# Patient Record
Sex: Male | Born: 1945
Health system: Southern US, Community
[De-identification: ages and names within clinical notes are randomized; demographics above are authoritative.]

## PROBLEM LIST (undated history)

## (undated) DIAGNOSIS — K429 Umbilical hernia without obstruction or gangrene: Secondary | ICD-10-CM

## (undated) DIAGNOSIS — M1990 Unspecified osteoarthritis, unspecified site: Secondary | ICD-10-CM

## (undated) DIAGNOSIS — K219 Gastro-esophageal reflux disease without esophagitis: Secondary | ICD-10-CM

## (undated) DIAGNOSIS — I10 Essential (primary) hypertension: Secondary | ICD-10-CM

## (undated) DIAGNOSIS — Z87442 Personal history of urinary calculi: Secondary | ICD-10-CM

## (undated) DIAGNOSIS — E785 Hyperlipidemia, unspecified: Secondary | ICD-10-CM

## (undated) DIAGNOSIS — N200 Calculus of kidney: Secondary | ICD-10-CM

## (undated) DIAGNOSIS — K4090 Unilateral inguinal hernia, without obstruction or gangrene, not specified as recurrent: Secondary | ICD-10-CM

## (undated) HISTORY — DX: Calculus of kidney: N20.0

## (undated) HISTORY — PX: HERNIA REPAIR: SHX51

## (undated) HISTORY — DX: Essential (primary) hypertension: I10

## (undated) HISTORY — PX: VASECTOMY: SHX75

## (undated) HISTORY — DX: Unspecified osteoarthritis, unspecified site: M19.90

## (undated) HISTORY — DX: Umbilical hernia without obstruction or gangrene: K42.9

## (undated) HISTORY — DX: Gastro-esophageal reflux disease without esophagitis: K21.9

---

## 2006-05-21 ENCOUNTER — Ambulatory Visit: Payer: Self-pay | Admitting: Internal Medicine

## 2006-05-21 LAB — CONVERTED CEMR LAB
ALT: 14 units/L (ref 0–40)
AST: 21 units/L (ref 0–37)
Alkaline Phosphatase: 71 units/L (ref 39–117)
BUN: 9 mg/dL (ref 6–23)
Basophils Relative: 2.9 % — ABNORMAL HIGH (ref 0.0–1.0)
Bilirubin, Direct: 0.1 mg/dL (ref 0.0–0.3)
CO2: 28 meq/L (ref 19–32)
Calcium: 8.9 mg/dL (ref 8.4–10.5)
Creatinine, Ser: 0.8 mg/dL (ref 0.4–1.5)
Crystals: NEGATIVE
Eosinophils Absolute: 0.1 10*3/uL (ref 0.0–0.6)
Eosinophils Relative: 2 % (ref 0.0–5.0)
GFR calc Af Amer: 127 mL/min
Glucose, Bld: 109 mg/dL — ABNORMAL HIGH (ref 70–99)
HCT: 44.7 % (ref 39.0–52.0)
Hemoglobin, Urine: NEGATIVE
Hemoglobin: 15.3 g/dL (ref 13.0–17.0)
Ketones, ur: NEGATIVE mg/dL
Lymphocytes Relative: 21.7 % (ref 12.0–46.0)
MCV: 92.1 fL (ref 78.0–100.0)
Neutro Abs: 4.1 10*3/uL (ref 1.4–7.7)
Neutrophils Relative %: 66.7 % (ref 43.0–77.0)
Total Bilirubin: 0.9 mg/dL (ref 0.3–1.2)
Total Protein, Urine: NEGATIVE mg/dL
Total Protein: 6.7 g/dL (ref 6.0–8.3)
Urine Glucose: NEGATIVE mg/dL
Urobilinogen, UA: 0.2 (ref 0.0–1.0)
VLDL: 19 mg/dL (ref 0–40)
WBC: 6.1 10*3/uL (ref 4.5–10.5)

## 2006-06-04 ENCOUNTER — Ambulatory Visit: Payer: Self-pay | Admitting: Internal Medicine

## 2006-07-13 ENCOUNTER — Ambulatory Visit: Payer: Self-pay | Admitting: Family Medicine

## 2007-02-17 ENCOUNTER — Ambulatory Visit: Payer: Self-pay | Admitting: Internal Medicine

## 2007-02-17 DIAGNOSIS — R972 Elevated prostate specific antigen [PSA]: Secondary | ICD-10-CM

## 2007-02-20 ENCOUNTER — Encounter: Payer: Self-pay | Admitting: Internal Medicine

## 2007-02-26 ENCOUNTER — Telehealth: Payer: Self-pay | Admitting: Internal Medicine

## 2007-03-03 ENCOUNTER — Encounter: Payer: Self-pay | Admitting: Internal Medicine

## 2007-06-18 ENCOUNTER — Encounter: Payer: Self-pay | Admitting: Internal Medicine

## 2007-06-18 DIAGNOSIS — Z9189 Other specified personal risk factors, not elsewhere classified: Secondary | ICD-10-CM | POA: Insufficient documentation

## 2007-06-18 DIAGNOSIS — IMO0001 Reserved for inherently not codable concepts without codable children: Secondary | ICD-10-CM

## 2007-06-18 DIAGNOSIS — M1990 Unspecified osteoarthritis, unspecified site: Secondary | ICD-10-CM | POA: Insufficient documentation

## 2007-06-18 DIAGNOSIS — K219 Gastro-esophageal reflux disease without esophagitis: Secondary | ICD-10-CM | POA: Insufficient documentation

## 2007-06-18 DIAGNOSIS — Z9889 Other specified postprocedural states: Secondary | ICD-10-CM

## 2007-10-31 ENCOUNTER — Encounter: Payer: Self-pay | Admitting: Internal Medicine

## 2008-05-14 ENCOUNTER — Encounter: Payer: Self-pay | Admitting: Internal Medicine

## 2008-11-19 ENCOUNTER — Encounter: Payer: Self-pay | Admitting: Internal Medicine

## 2009-05-20 ENCOUNTER — Encounter: Payer: Self-pay | Admitting: Internal Medicine

## 2009-06-09 ENCOUNTER — Ambulatory Visit: Payer: Self-pay | Admitting: Internal Medicine

## 2009-06-09 DIAGNOSIS — J309 Allergic rhinitis, unspecified: Secondary | ICD-10-CM

## 2009-12-02 ENCOUNTER — Encounter: Payer: Self-pay | Admitting: Internal Medicine

## 2010-03-07 NOTE — Letter (Signed)
[facility]   Imported By: Sherian Rein 06/02/2009 09:06:38  _____________________________________________________________________  External Attachment:    Type:   Image     Comment:   External Document

## 2010-03-07 NOTE — Assessment & Plan Note (Signed)
Summary: cough,cold/tired/cd   Vital Signs:  Patient profile:   65 year old male Height:      67 inches Weight:      168 pounds BMI:     26.41 O2 Sat:      97 % on Room air Temp:     98.4 degrees F oral Pulse rate:   78 / minute BP sitting:   128 / 88  (left arm) Cuff size:   regular  Vitals Entered By: Bill Salinas CMA (Jun 09, 2009 2:58 PM)  O2 Flow:  Room air CC: pt here with complaint of head congestion, runny nose, cough, and fatigue x about 3 weeks/ ab   CC:  pt here with complaint of head congestion, runny nose, cough, and and fatigue x about 3 weeks/ ab.  History of Present Illness: The patient has been in good health until approximately 3-4 weeks ago when he began to have sneezing, runny nose, watery eyes, headache, sinus pressure and cough.  At that time he took Mucinex with minimal relief in symptoms.  Since then he has had waxing and waning of his symptoms but continues to have a dry, nonproductive cough as well as intermittent watery eyes and runny nose.  He also complains of feeling overall weak and tired.  He is still able to complete his usual daily activities.  He denies fever, chills, loss of appetite, swollen lymph nodes, or shortness of breath.  He endorses chest pain, but only a sharp pain when coughing.  He has no known history of environmental allergies.  The patient is very interested in having a CXR to rule out any serious problems.    Current Medications (verified): 1)  None  Allergies (verified): No Known Drug Allergies  Past History:  Past Medical History: Last updated: 06/18/2007 GERD Family history mother has arthritis. also has a hx of breast cancer and is a survivor. Father witjh hyperlipidemia, heart disease, hypertension and diabetes. Osteoarthritis  Past Surgical History: Last updated: 06/18/2007 Vasectomy Trauma, fracture of the left index finger. Hernia Surgery (1984)  Risk Factors: Smoking Status: never (06/18/2007)  Review of  Systems       The patient complains of chest pain, prolonged cough, and headaches.  The patient denies anorexia, fever, weight loss, weight gain, decreased hearing, hoarseness, dyspnea on exertion, hemoptysis, abdominal pain, and enlarged lymph nodes.         chest pain as noted in HPI- sharp pain with cough.  Physical Exam  General:  Alert, wellnourished man in no apparent distress. Head:  Normocephalic, atraumatic. Eyes:  Vision grossly intact.  PERRL.  no conjunctival injection.  Excessive tearing noted. Ears:  R ear normal and L ear normal.   Nose:  no external deformity and no nasal discharge.   Mouth:  pharynx pink and moist.   Neck:  supple and no masses.   Lungs:  normal respiratory effort, normal breath sounds, no crackles, and no wheezes.   Heart:  normal rate, regular rhythm, no murmur, no gallop, and no rub.   Abdomen:  soft, non-tender, no distention, no masses, no guarding, and no rigidity.   Cervical Nodes:  no anterior cervical adenopathy and no posterior cervical adenopathy.     Impression & Recommendations:  Problem # 1:  ALLERGIC RHINITIS (ICD-477.9) The patient's presentation is consistent with an allergic rhinitis.  He was reassured that the had no symptoms concerning for infection at this time.   Plan- Loratadine 10mg  once daily, sudafed 30mg  2-3x/day  as needed for sinus pressure, benzonatate 100mg  as needed for cough, Mucinex as needed. Pt was counseled on signs concerning for infection and told to return if he develops fever or purulent sputum.    Problem # 2:  ELEVATED PROSTATE SPECIFIC ANTIGEN (ICD-790.93) Patient continues to be followed by Dr. Laverle Patter for his elevated PSA.  He was last seen 05/20/09 and will follow up again in 6months.  Dr. Laverle Patter is also following the patient's nephrolithiasis.

## 2010-03-07 NOTE — Letter (Signed)
[facility]   Imported By: Sherian Rein 12/14/2009 10:26:50  _____________________________________________________________________  External Attachment:    Type:   Image     Comment:   External Document

## 2010-04-13 ENCOUNTER — Encounter: Payer: Self-pay | Admitting: Internal Medicine

## 2010-04-25 NOTE — Letter (Signed)
[facility]: [facility]   Imported By: Sherian Rein 04/19/2010 11:02:21  _____________________________________________________________________  External Attachment:    Type:   Image     Comment:   External Document

## 2010-06-23 NOTE — Assessment & Plan Note (Signed)
[facility]                           PRIMARY CARE OFFICE NOTE   NAME:[name], [name] SCOLARO                       MRN:          [id number]  DATE:06/04/2006                            DOB:          1945-04-05    [name] [name] is a 65 year old Caucasian gentleman who presents to establish  ongoing continuity and care.  He reports he feels well and has no active  medical complaints at today's visit.   PAST SURGICAL HISTORY:  1. Wisdom tooth extracted years ago.  2. Left herniorrhaphy in 1984.  3. Vasectomy in 1985.  4. Trauma, fracture of the left index finger.   PAST MEDICAL HISTORY:  1. Usual childhood diseases.  2. GERD.   CURRENT MEDICATIONS:  None.   ALLERGIES:  The patient has no known drug allergies.   FAMILY HISTORY:  Mother is 22.  Father is 35.  One brother healthy, two  sisters healthy.  Mother has arthritis.  Mother has a history of breast  cancer and is a survivor.  Father with hyperlipidemia, heart disease,  hypertension, and diabetes.   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  He has worked for [employer] for 40  years.  The patient is married 36 years.  He has two sons, no  grandchildren.  The patient's wife is in reasonable health.  She does  have fibromyalgia and osteoarthritis.   REVIEW OF SYSTEMS:  Negative for any fevers, sweats, chills or other  constitutional problems.  It has been more than 24 months since he had  an eye exam.  No ENT complaints or cardiovascular complaints or  respiratory problems.  Rare heartburn treated with over-the-counter  Pepcid.  No GU problems with nocturia 0-1.  No musculoskeletal,  neurologic complaints.   PHYSICAL EXAMINATION:  VITAL SIGNS:  Temperature 98.5, blood pressure  131/88, pulse 63, weight 162.  GENERAL APPEARANCE:  This is a well-nourished, well-developed Caucasian  gentleman who looks his stated age.  No acute  distress.  HEENT:  Normocephalic, atraumatic.  EACs and TMs were normal.  Oropharynx with native dentition in good repair.  No buccal or papular  lesions were noted.  Posterior oropharynx was clear.  Conjunctivae and  sclerae were clear.  PERRLA, EOMI.  Funduscopic exam was unremarkable.  NECK:  Supple without thyromegaly.  NODES:  No adenopathy was noted in his cervical or supraclavicular  regions.  CHEST:  With CVA tenderness.  LUNGS:  Clear to auscultation and percussion.  CARDIOVASCULAR:  2+ radial pulses without JVD or carotid bruits.  He had  a quiet precordium with regular rate and rhythm without murmurs, rubs or  gallops.  ABDOMEN:  Soft.  No guarding or rebound.  No organosplenomegaly was  appreciated.  GENITALIA:  Normal male with bilaterally descended testicles without  masses.  No sign of inguinal hernia.  RECTAL:  Revealed normal sphincter tone.  Prostate was firm, normal in  size.  No nodules were noted.  There was no tenderness.  EXTREMITIES:  Without clubbing, cyanosis, edema or deformity.  NEUROLOGIC:  Nonfocal.   LABORATORY DATABASE:  Hemoglobin 15.3 g, white count 6100 with normal  differential.  Chemistries revealed normal electrolytes.  Serum glucose  was 109.  Renal function was normal with creatinine 0.8 and GFR 105.  Liver functions were normal.  Cholesterol 171, triglycerides 93, HDL  33.2, LDL 119.  PSA 6.31.  Urinalysis was unremarkable.   ASSESSMENT/PLAN:  1. GENITOURINARY:  The patient with an elevated PSA.  No prior studies      are available.  The patient gives no recent history of prostatitis.      His prostate examination revealed no specific nodules, although the      prostate was firm.   I discussed the options at length with the patient including watchful  waiting with repeat PSA versus immediate referral to urology.  At this  point he has agreed to follow up PSA in four months.  I also provided  him with Internet resource of medlineplus.gov  so he can read about  elevated PSAs and prostate cancer.  If he should have second thoughts  and wishes to pursue GU evaluation at this time, will be happy to  arrange for referral.  Otherwise, he will return in four months for  followup study.   1. Health maintenance.  The patient had 12-lead electrocardiogram      today which revealed a normal sinus rhythm with poor R wave      progression in V1; otherwise, unremarkable.  The patient's LDL      cholesterol is at goal for a gentleman with no cardiac risk      factors, goal being 130 or less.  Serum glucose was considered a      normal range of 70 to 115.   SUMMARY:  He is a very pleasant gentleman who seems to be physically fit  at this time except for an elevated PSA.  Plan is for repeat study in  four months.     Rosalyn Gess Norins, MD  Electronically Signed    MEN/MedQ  DD: 06/05/2006  DT: 06/05/2006  Job #: [id number]   cc:   Murvin Natal

## 2010-10-11 ENCOUNTER — Ambulatory Visit (INDEPENDENT_AMBULATORY_CARE_PROVIDER_SITE_OTHER): Payer: BC Managed Care – PPO | Admitting: General Surgery

## 2010-10-11 ENCOUNTER — Encounter (INDEPENDENT_AMBULATORY_CARE_PROVIDER_SITE_OTHER): Payer: Self-pay | Admitting: General Surgery

## 2010-10-11 VITALS — BP 142/86 | HR 80

## 2010-10-11 DIAGNOSIS — K429 Umbilical hernia without obstruction or gangrene: Secondary | ICD-10-CM

## 2010-10-11 NOTE — Progress Notes (Signed)
Chief Complaint  Patient presents with  . Umbilical Hernia    HPI [name] [name] is a 65 y.o. male. HPI This is a 65 year old male who I initially saw in May of 2012 for umbilical bulge for about a year and a half. This was aggravated by palpation as well as by some activity. I discussed with him the options and recommended a repair at that point. He returns now to discuss repair as you would be able to pursue that. He has no change in his symptoms since then.  Past Medical History  Diagnosis Date  . Umbilical hernia   . Nephrolithiasis     Past Surgical History  Procedure Date  . Hernia repair     LIH    History reviewed. No pertinent family history.  Social History History  Substance Use Topics  . Smoking status: Never Smoker   . Smokeless tobacco: Not on file  . Alcohol Use: No    No Known Allergies  No current outpatient prescriptions on file.    Review of Systems Review of Systems  Constitutional: Negative.   HENT: Negative.   Eyes: Negative.   Respiratory: Negative.   Cardiovascular: Negative.   Gastrointestinal: Negative.   Genitourinary: Negative.   Musculoskeletal: Negative.   Neurological: Negative.   Hematological: Negative.   Psychiatric/Behavioral: Negative.     Blood pressure 142/86, pulse 80.  Physical Exam Physical Exam  Constitutional: He appears well-developed and well-nourished.  Eyes: No scleral icterus.  Neck: Neck supple.  Cardiovascular: Normal rate, regular rhythm and normal heart sounds.   Pulmonary/Chest: Breath sounds normal. He has no wheezes. He has no rales.  Abdominal: Soft. Bowel sounds are normal. He exhibits no distension and no mass. There is no tenderness. There is no rebound and no guarding. A hernia (reducible nontender supraumbilical) is present.  Lymphadenopathy:    He has no cervical adenopathy.     Assessment    Umbilical hernia    Plan    I had seen him before and may have recommended consider repair  at that point. He is now ready to have his umbilical hernia repaired. I discussed an open supraumbilical hernia repair with mesh. The risks are better not limited to bleeding, infection, recurrence, seroma. He understands he'll need about 4 weeks and no heavy lifting and work restrictions over that time period is well.      Shemekia Patane 10/11/2010, 5:31 PM

## 2010-10-17 ENCOUNTER — Ambulatory Visit (INDEPENDENT_AMBULATORY_CARE_PROVIDER_SITE_OTHER): Payer: BC Managed Care – PPO | Admitting: Internal Medicine

## 2010-10-17 ENCOUNTER — Encounter: Payer: Self-pay | Admitting: Internal Medicine

## 2010-10-17 VITALS — BP 124/82 | HR 87 | Temp 99.1°F | Wt 167.0 lb

## 2010-10-17 DIAGNOSIS — A493 Mycoplasma infection, unspecified site: Secondary | ICD-10-CM

## 2010-10-17 MED ORDER — BENZONATATE 100 MG PO CAPS: 100.0000 mg | ORAL_CAPSULE | Freq: Three times a day (TID) | ORAL | Status: DC | PRN

## 2010-10-17 MED ORDER — AZITHROMYCIN 250 MG PO TABS: ORAL_TABLET | ORAL | Status: AC

## 2010-10-17 MED ORDER — PROMETHAZINE-CODEINE 6.25-10 MG/5ML PO SYRP: 5.0000 mL | ORAL_SOLUTION | ORAL | Status: AC | PRN

## 2010-10-17 NOTE — Patient Instructions (Signed)
Prolonged cough with low energy and low grade fever today @ 99.1 degrees - sounds like mycoplasma = walking pneumonia. Plan: z-pak as directed (antibiotic); tessalon perles three times a day for "feather" cough; promethazine/codiene cough syrup 1 tsp every 6 hours as needed - watch for drowsiness.

## 2010-10-17 NOTE — Progress Notes (Signed)
Subjective:    Patient ID: Fontaine No, male    DOB: 08-16-1945, 65 y.o.   MRN: [id number]  HPI Mr. Hamilton reports a two week h/o non-producitve cough with no fever, no SOB, no wheezing. He hasn't felt weak. He does have chest wall pain due to the coughing. Does have a low grade fever today. He has been feeling a little low on energy.   I have reviewed the patient's medical history in detail and updated the computerized patient record. He continues to follow with Dr. Laverle Patter for elevated PSA - he has had prostate biopsy that was normal. He has also had another bout of kidney stones - passed spontaneously.  Ventral abdominal hernia - has seen Dr. Dwain Sarna and is planning elective repair in November.   Review of Systems System review is negative for any constitutional, cardiac, GI or neuro symptoms or complaints     Objective:   Physical Exam Vitals - low grade fever Genm'l - WNWD white male HEENT- TMs normal, throat clear Nodes - neg Chest - Clear to AP Cor - RRR       Assessment & Plan:  Mycoplasma pneumoniae -   Plan - z-pak           Prom/cod 1 tsp q 6           Tessalon perle

## 2010-10-31 ENCOUNTER — Ambulatory Visit (INDEPENDENT_AMBULATORY_CARE_PROVIDER_SITE_OTHER): Payer: BC Managed Care – PPO | Admitting: Internal Medicine

## 2010-10-31 VITALS — BP 126/74 | HR 96 | Temp 99.0°F | Wt 163.0 lb

## 2010-10-31 DIAGNOSIS — R05 Cough: Secondary | ICD-10-CM

## 2010-10-31 MED ORDER — BENZONATATE 100 MG PO CAPS: 100.0000 mg | ORAL_CAPSULE | Freq: Three times a day (TID) | ORAL | Status: DC | PRN

## 2010-10-31 MED ORDER — PREDNISONE 20 MG PO TABS: 20.0000 mg | ORAL_TABLET | Freq: Every day | ORAL | Status: AC

## 2010-10-31 NOTE — Patient Instructions (Signed)
Cough - the mycoplasma infection should be resolved. What you are having now sounds like it may be a cyclical cough: cough causes tracheal irritation causes cough causes tracheal irritation causes cough causes tracheal irritation, etc.  Plan - take the codeine cough syrup three times a day and if a full teaspoon is too strong take 1/2 teaspoon; take the tessalon perles 3 times a day for another 7 days; take prednisone 20mg  once a day for 7 days to calm the tracheal irritation.  If this fails to rid you of this cough will move to more evaluation: chest x-ray, possibly CT chest, treatment for reflux and possible referral to a pulmonologist.

## 2010-11-02 NOTE — Progress Notes (Signed)
Subjective:    Patient ID: Fontaine No, male    DOB: [birth year], 65 y.o.   MRN: [id number]  HPI Mr. Kowalke was seen about a week ago for cough and respiratory symptoms presumed to be mycoplasma pneumoniae. He was treated with azithromycin. He did get to feeling better but has a persistent, non-productive cough. He describes a tickle cough - no sputum production. He has had no fevers, no SOB/DOE    Review of Systems System review is negative for any constitutional, cardiac, pulmonary, GI or neuro symptoms or complaints other than as described in the HPI.     Objective:   Physical Exam Vitals noted - good oxygen saturation, no fever Gen'l - WNWD white male Chest - good breath sounds, no increased WOB Cor - RRR       Assessment & Plan:  Cough - suspect cyclical cough.  Plan - continue prom/cod syrup 1/2 or 1 tsp q 6           Continue tessalon perles           Add short course of steroids           For failure of treatment will move ahead with imaging and reevaluation

## 2010-12-04 ENCOUNTER — Other Ambulatory Visit (INDEPENDENT_AMBULATORY_CARE_PROVIDER_SITE_OTHER): Payer: Self-pay | Admitting: General Surgery

## 2010-12-04 ENCOUNTER — Ambulatory Visit (HOSPITAL_COMMUNITY)
Admission: RE | Admit: 2010-12-04 | Discharge: 2010-12-04 | Disposition: A | Discharge status: Home / Self Care | Payer: BC Managed Care – PPO | Source: Ambulatory Visit | Attending: General Surgery | Admitting: General Surgery

## 2010-12-04 ENCOUNTER — Encounter (HOSPITAL_COMMUNITY): Payer: BC Managed Care – PPO

## 2010-12-04 DIAGNOSIS — Z01818 Encounter for other preprocedural examination: Secondary | ICD-10-CM

## 2010-12-04 DIAGNOSIS — Z01812 Encounter for preprocedural laboratory examination: Secondary | ICD-10-CM | POA: Insufficient documentation

## 2010-12-04 DIAGNOSIS — K429 Umbilical hernia without obstruction or gangrene: Secondary | ICD-10-CM | POA: Insufficient documentation

## 2010-12-04 DIAGNOSIS — Z01810 Encounter for preprocedural cardiovascular examination: Secondary | ICD-10-CM | POA: Insufficient documentation

## 2010-12-04 LAB — CBC
Hemoglobin: 12.6 g/dL — ABNORMAL LOW (ref 13.0–17.0)
MCH: 26.9 pg (ref 26.0–34.0)
MCHC: 32.1 g/dL (ref 30.0–36.0)
MCV: 83.8 fL (ref 78.0–100.0)
Platelets: 299 10*3/uL (ref 150–400)

## 2010-12-04 LAB — BASIC METABOLIC PANEL
BUN: 12 mg/dL (ref 6–23)
Calcium: 9.2 mg/dL (ref 8.4–10.5)
GFR calc non Af Amer: 68 mL/min — ABNORMAL LOW (ref >90)
Glucose, Bld: 93 mg/dL (ref 70–99)

## 2010-12-04 LAB — DIFFERENTIAL
Basophils Relative: 0 % (ref 0–1)
Eosinophils Absolute: 0.3 10*3/uL (ref 0.0–0.7)
Eosinophils Relative: 4 % (ref 0–5)
Monocytes Relative: 11 % (ref 3–12)
Neutrophils Relative %: 52 % (ref 43–77)

## 2010-12-04 LAB — SURGICAL PCR SCREEN: MRSA, PCR: NEGATIVE

## 2010-12-06 ENCOUNTER — Ambulatory Visit (HOSPITAL_COMMUNITY)
Admission: RE | Admit: 2010-12-06 | Discharge: 2010-12-06 | Disposition: A | Discharge status: Home / Self Care | Payer: BC Managed Care – PPO | Source: Ambulatory Visit | Attending: General Surgery | Admitting: General Surgery

## 2010-12-06 DIAGNOSIS — K439 Ventral hernia without obstruction or gangrene: Secondary | ICD-10-CM | POA: Insufficient documentation

## 2010-12-06 DIAGNOSIS — Z01810 Encounter for preprocedural cardiovascular examination: Secondary | ICD-10-CM | POA: Insufficient documentation

## 2010-12-06 DIAGNOSIS — Z01812 Encounter for preprocedural laboratory examination: Secondary | ICD-10-CM | POA: Insufficient documentation

## 2010-12-06 DIAGNOSIS — K429 Umbilical hernia without obstruction or gangrene: Secondary | ICD-10-CM

## 2010-12-06 DIAGNOSIS — Z01818 Encounter for other preprocedural examination: Secondary | ICD-10-CM | POA: Insufficient documentation

## 2010-12-06 HISTORY — PX: HERNIA REPAIR: SHX51

## 2010-12-11 NOTE — Op Note (Signed)
[name] [name], [name] [name]                ACCOUNT NO.:  [id number]  MEDICAL RECORD NO.:  [id number]  LOCATION:  [facility]                         FACILITY:  [facility]  PHYSICIAN:  Juanetta Gosling, MDDATE OF BIRTH:  19-Nov-1945  DATE OF PROCEDURE: DATE OF DISCHARGE:                              OPERATIVE REPORT   PREOPERATIVE DIAGNOSIS:  Supraumbilical hernia.  POSTOPERATIVE DIAGNOSIS:  Supraumbilical hernia.  PROCEDURE:  Umbilical hernia repair with Proceed Ventral Patch.  SURGEON:  Juanetta Gosling, MD  ASSISTANT:  None.  ANESTHESIA:  General.  SPECIMENS:  None.  DRAINS:  None.  COMPLICATIONS:  None.  ESTIMATED BLOOD LOSS:  Minimal.  DISPOSITION:  Recovery room in stable condition.  INDICATIONS:  This is a 65 year old male who I initially saw in May 2012 for an umbilical bulge about a year and a half.  He waited sometime and then he returning.  We discussed repair of this area.  We discussed the restrictions postoperatively as well the risks and benefits associated with the operation.  We discussed an open repair.  PROCEDURE:  After informed consent was obtained, the patient was taken to the operating room.  He was administered 1 g of intravenous cefazolin.  Sequential compression devices were placed on lower extremities.  He was then placed under general endotracheal anesthesia without complication.  His abdomen was then prepped and draped in a standard sterile surgical fashion.  Surgical time-out was then performed.  Due to the fact that this was a supraumbilical position, I elected to make a little bit of a vertical incision above his umbilicus carried out down below his umbilicus.  I carried this down to the level of the hernia.  He had about a 1.5-cm supraumbilical hernia and then about a 2- mm umbilical hernia that were very close to each other.  I lifted his umbilicus up off the fascia.  I then developed a preperitoneal space.  I actually closed with 0 Ethibond  suture the small defect and then, I placed a Proceed Ventral Patch underneath all this.  This obliterated both the larger defect and covered the smaller defect as well.  The mesh was then placed in a good position and the bottom portion was laid flat. I then sutured the ends to the fascia as well as down to the fascia laterally on both sides.  The mesh was in good position.  There was no evidence of any further defect.  Hemostasis was obtained.  This was closed with 3-0 Vicryl, 4-0 Monocryl.  Steri- Strips and sterile dressing were placed.  10 cc of 0.25% Marcaine were infiltrated.  He tolerated this well and was extubated and transferred to the recovery room in stable condition.     Juanetta Gosling, MD     MCW/MEDQ  D:  12/06/2010  T:  12/06/2010  Job:  [id number]  cc:   Rosalyn Gess. Norins, MD [address] [place] [address]  Heloise Purpura, MD Fax: [phone]  Electronically Signed by Emelia Loron MD on 12/11/2010 01:24:40 PM

## 2010-12-27 ENCOUNTER — Telehealth (INDEPENDENT_AMBULATORY_CARE_PROVIDER_SITE_OTHER): Payer: Self-pay

## 2010-12-27 NOTE — Telephone Encounter (Signed)
Tried calling pt to schedule but he doesn't answer and you cant leave a message./ [facility]

## 2011-01-05 ENCOUNTER — Encounter (INDEPENDENT_AMBULATORY_CARE_PROVIDER_SITE_OTHER): Payer: Self-pay | Admitting: General Surgery

## 2011-01-05 ENCOUNTER — Ambulatory Visit (INDEPENDENT_AMBULATORY_CARE_PROVIDER_SITE_OTHER): Payer: BC Managed Care – PPO | Admitting: General Surgery

## 2011-01-05 VITALS — BP 114/82 | HR 60 | Temp 98.4°F | Resp 16 | Ht 66.0 in | Wt 160.5 lb

## 2011-01-05 DIAGNOSIS — Z09 Encounter for follow-up examination after completed treatment for conditions other than malignant neoplasm: Secondary | ICD-10-CM

## 2011-01-05 NOTE — Progress Notes (Signed)
Subjective:     Patient ID: [name] [name], male   DOB: 10/14/1945, 65 y.o.   MRN: [id number]  HPI His is a 65 year old male elected to the operating room about a month ago now. He did not have any supraumbilical and umbilical hernia at the same time. There were 2 hernias are repaired with a proceed central patch. I also closed rimarily and a very small 2 mm defect. He's done well. He returns today without any significant complaints. He is beginning to increase his activity very slowly.  Review of Systems     Objective:   Physical Exam Well healed midline wound without infection or seroma    Assessment:     S/p ventral hernia repair    Plan:     He is recovering well. I told him to be reasonable to keep him out for another month to let this area heal because there were 2 hernias. I'm going to have him followup with me as needed.I wrote him a sheet for work to keep him out of work until 7 January upon which he can return without any restrictions.

## 2011-01-09 ENCOUNTER — Telehealth (INDEPENDENT_AMBULATORY_CARE_PROVIDER_SITE_OTHER): Payer: Self-pay

## 2011-01-09 NOTE — Telephone Encounter (Signed)
LMOM notifying pt that I faxed his last office note from 11-30 along with the RTW note to [employer] [phone]/ [facility]

## 2011-07-20 ENCOUNTER — Ambulatory Visit (INDEPENDENT_AMBULATORY_CARE_PROVIDER_SITE_OTHER): Payer: BC Managed Care – PPO | Admitting: Internal Medicine

## 2011-07-20 ENCOUNTER — Encounter: Payer: Self-pay | Admitting: Internal Medicine

## 2011-07-20 VITALS — BP 132/82 | HR 70 | Temp 98.4°F | Resp 16 | Ht 66.0 in | Wt 166.0 lb

## 2011-07-20 DIAGNOSIS — IMO0001 Reserved for inherently not codable concepts without codable children: Secondary | ICD-10-CM

## 2011-07-20 DIAGNOSIS — T148XXA Other injury of unspecified body region, initial encounter: Secondary | ICD-10-CM

## 2011-07-20 DIAGNOSIS — Z23 Encounter for immunization: Secondary | ICD-10-CM

## 2011-07-20 DIAGNOSIS — T6391XA Toxic effect of contact with unspecified venomous animal, accidental (unintentional), initial encounter: Secondary | ICD-10-CM

## 2011-07-20 DIAGNOSIS — T63441A Toxic effect of venom of bees, accidental (unintentional), initial encounter: Secondary | ICD-10-CM

## 2011-07-20 DIAGNOSIS — T63461A Toxic effect of venom of wasps, accidental (unintentional), initial encounter: Secondary | ICD-10-CM

## 2011-07-20 DIAGNOSIS — W57XXXA Bitten or stung by nonvenomous insect and other nonvenomous arthropods, initial encounter: Secondary | ICD-10-CM | POA: Insufficient documentation

## 2011-07-20 MED ORDER — HYDROXYZINE HCL 10 MG PO TABS: 10.0000 mg | ORAL_TABLET | ORAL | Status: AC | PRN

## 2011-07-20 MED ORDER — DOXYCYCLINE HYCLATE 100 MG PO TBEC: 100.0000 mg | DELAYED_RELEASE_TABLET | Freq: Two times a day (BID) | ORAL | Status: AC

## 2011-07-20 MED ORDER — METHYLPREDNISOLONE ACETATE 80 MG/ML IJ SUSP
120.0000 mg | Freq: Once | INTRAMUSCULAR | Status: AC
  Administered 2011-07-20: 120 mg via INTRAMUSCULAR

## 2011-07-20 MED ORDER — FLUOCINONIDE-E 0.05 % EX CREA: TOPICAL_CREAM | Freq: Two times a day (BID) | CUTANEOUS | Status: DC

## 2011-07-20 NOTE — Progress Notes (Signed)
Subjective:    Patient ID: [name] [name], male    DOB: Dec 09, 1945, 66 y.o.   MRN: [id number]  Allergic Reaction This is a new problem. The current episode started 5 to 7 days ago. The problem occurs constantly. The problem has been gradually worsening since onset. The problem is moderate. The patient was exposed to insect bit. The time of exposure was just prior to onset. The exposure occurred at home. Associated symptoms include itching and a rash. Pertinent negatives include [name] abdominal pain, chest pain, chest pressure, coughing, diarrhea, difficulty breathing, drooling, eye itching, eye redness, eye watering, globus sensation, hyperventilation, stridor, trouble swallowing, vomiting or wheezing. There is [name] swelling present. Past treatments include topical corticosteroid. The treatment provided mild relief.      Review of Systems  Constitutional: Negative.   HENT: Negative.  Negative for drooling and trouble swallowing.   Eyes: Negative.  Negative for redness and itching.  Respiratory: Negative for apnea, cough, choking, chest tightness, shortness of breath, wheezing and stridor.   Cardiovascular: Negative for chest pain.  Gastrointestinal: Negative.  Negative for vomiting, abdominal pain and diarrhea.  Genitourinary: Negative.   Musculoskeletal: Negative.   Skin: Positive for itching, rash and wound. Negative for color change and pallor.  Neurological: Negative.   Hematological: Negative for adenopathy. Does not bruise/bleed easily.  Psychiatric/Behavioral: Negative.        Objective:   Physical Exam  Vitals reviewed. Constitutional: He is oriented to person, place, and time. He appears well-developed and well-nourished. [name] distress.  HENT:  Head: Normocephalic and atraumatic.  Mouth/Throat: Oropharynx is clear and moist. [name] oropharyngeal exudate.  Eyes: Conjunctivae are normal. Right eye exhibits [name] discharge. Left eye exhibits [name] discharge. [name] scleral icterus.  Neck: Normal  range of motion. Neck supple. [name] JVD present. [name] tracheal deviation present. [name] thyromegaly present.  Cardiovascular: Normal rate, regular rhythm, normal heart sounds and intact distal pulses.  Exam reveals [name] gallop and [name] friction rub.   [name] murmur heard. Pulmonary/Chest: Effort normal and breath sounds normal. [name] stridor. [name] respiratory distress. He has [name] wheezes. He has [name] rales. He exhibits [name] tenderness.  Abdominal: Soft. Bowel sounds are normal. He exhibits [name] distension. There is [name] tenderness. There is [name] rebound and [name] guarding.  Musculoskeletal: Normal range of motion. He exhibits [name] edema and [name] tenderness.  Lymphadenopathy:    He has [name] cervical adenopathy.  Neurological: He is oriented to person, place, and time.  Skin: Skin is warm, dry and intact. Lesion and rash noted. [name] abrasion, [name] bruising, [name] burn, [name] ecchymosis, [name] laceration, [name] petechiae and [name] purpura noted. Rash is not macular, not papular, not nodular, not pustular, not vesicular and not urticarial. He is not diaphoretic. There is erythema. [name] cyanosis. [name] pallor. Nails show [name] clubbing.     Psychiatric: He has a normal mood and affect. His behavior is normal. Judgment and thought content normal.          Assessment & Plan:

## 2011-07-20 NOTE — Patient Instructions (Signed)
Insect Bite Mosquitoes, flies, fleas, bedbugs, and many other insects can bite. Insect bites are different from insect stings. A sting is when venom is injected into the skin. Some insect bites can transmit infectious diseases. SYMPTOMS  Insect bites usually turn red, swell, and itch for 2 to 4 days. They often go away on their own. TREATMENT  Your caregiver may prescribe antibiotic medicines if a bacterial infection develops in the bite. HOME CARE INSTRUCTIONS  Do not scratch the bite area.   Keep the bite area clean and dry. Wash the bite area thoroughly with soap and water.   Put ice or cool compresses on the bite area.   Put ice in a plastic bag.   Place a towel between your skin and the bag.   Leave the ice on for 20 minutes, 4 times a day for the first 2 to 3 days, or as directed.   You may apply a baking soda paste, cortisone cream, or calamine lotion to the bite area as directed by your caregiver. This can help reduce itching and swelling.   Only take over-the-counter or prescription medicines as directed by your caregiver.   If you are given antibiotics, take them as directed. Finish them even if you start to feel better.  You may need a tetanus shot if:  You cannot remember when you had your last tetanus shot.   You have never had a tetanus shot.   The injury broke your skin.  If you get a tetanus shot, your arm may swell, get red, and feel warm to the touch. This is common and not a problem. If you need a tetanus shot and you choose not to have one, there is a rare chance of getting tetanus. Sickness from tetanus can be serious. SEEK IMMEDIATE MEDICAL CARE IF:   You have increased pain, redness, or swelling in the bite area.   You see a red line on the skin coming from the bite.   You have a fever.   You have joint pain.   You have a headache or neck pain.   You have unusual weakness.   You have a rash.   You have chest pain or shortness of breath.   You  have abdominal pain, nausea, or vomiting.   You feel unusually tired or sleepy.  MAKE SURE YOU:   Understand these instructions.   Will watch your condition.   Will get help right away if you are not doing well or get worse.  Document Released: 03/01/2004 Document Revised: 01/11/2011 Document Reviewed: 08/23/2010 [employer] Patient Information 2012 [employer]. 

## 2011-07-22 ENCOUNTER — Encounter: Payer: Self-pay | Admitting: Internal Medicine

## 2011-07-22 NOTE — Assessment & Plan Note (Signed)
Start doxycycline to cover MRSA and tick born disease

## 2011-07-22 NOTE — Assessment & Plan Note (Signed)
Treated with depo-medrol IM injection as well as topical steroids and antihistamines

## 2012-06-24 ENCOUNTER — Encounter: Payer: Self-pay | Admitting: Internal Medicine

## 2012-06-24 ENCOUNTER — Ambulatory Visit (INDEPENDENT_AMBULATORY_CARE_PROVIDER_SITE_OTHER): Payer: Medicare Other | Admitting: Internal Medicine

## 2012-06-24 VITALS — BP 140/80 | HR 92 | Temp 98.9°F | Ht 66.0 in | Wt 160.4 lb

## 2012-06-24 DIAGNOSIS — T63461A Toxic effect of venom of wasps, accidental (unintentional), initial encounter: Secondary | ICD-10-CM

## 2012-06-24 DIAGNOSIS — T6391XA Toxic effect of contact with unspecified venomous animal, accidental (unintentional), initial encounter: Secondary | ICD-10-CM

## 2012-06-24 DIAGNOSIS — W57XXXA Bitten or stung by nonvenomous insect and other nonvenomous arthropods, initial encounter: Secondary | ICD-10-CM

## 2012-06-24 DIAGNOSIS — J309 Allergic rhinitis, unspecified: Secondary | ICD-10-CM

## 2012-06-24 DIAGNOSIS — L259 Unspecified contact dermatitis, unspecified cause: Secondary | ICD-10-CM

## 2012-06-24 DIAGNOSIS — IMO0001 Reserved for inherently not codable concepts without codable children: Secondary | ICD-10-CM

## 2012-06-24 MED ORDER — DOXYCYCLINE HYCLATE 100 MG PO TABS: 100.0000 mg | ORAL_TABLET | Freq: Two times a day (BID) | ORAL | Status: DC

## 2012-06-24 MED ORDER — METHYLPREDNISOLONE ACETATE 80 MG/ML IJ SUSP
80.0000 mg | Freq: Once | INTRAMUSCULAR | Status: AC
  Administered 2012-06-24: 80 mg via INTRAMUSCULAR

## 2012-06-24 MED ORDER — FLUOCINONIDE-E 0.05 % EX CREA: TOPICAL_CREAM | Freq: Two times a day (BID) | CUTANEOUS | Status: AC

## 2012-06-24 NOTE — Assessment & Plan Note (Signed)
For lidex asd,  to f/u any worsening symptoms or concerns

## 2012-06-24 NOTE — Assessment & Plan Note (Signed)
Mild seasonal flare - for otc allegra prn,  to f/u any worsening symptoms or concerns

## 2012-06-24 NOTE — Assessment & Plan Note (Signed)
Mild to mod, for antibx course,  to f/u any worsening symptoms or concerns 

## 2012-06-24 NOTE — Progress Notes (Signed)
Subjective:    Patient ID: [name] [name] [name], male    DOB: 10-20-1945, 67 y.o.   MRN: [id number]  HPI  Here after a weekend camping with mult possible insect bites about the waist, and also a itchy area relatively large to the prox left thigh for 2 days as well.  [name] [name] fever, but likely to have had ticks it seems.   Pt denies fever, wt loss, night sweats, loss of appetite, or other constitutional symptoms. Pt denies chest pain, increased sob or doe, wheezing, orthopnea, PND, increased LE swelling, palpitations, dizziness or syncope.   Pt denies polydipsia, polyuria. Does have several wks ongoing nasal allergy symptoms with clearish congestion, itch and sneezing, without fever, pain, ST, cough, swelling or wheezing.  Past Medical History  Diagnosis Date  . Umbilical hernia   . Nephrolithiasis   . GERD (gastroesophageal reflux disease)   . Hypertension   . Diabetes mellitus   . Osteoarthritis    Past Surgical History  Procedure Laterality Date  . Vasectomy    . Hernia repair      [facility]  . Hernia repair  12/06/10    umbilical hernia     reports that he has never smoked. He has never used smokeless tobacco. He reports that he does not drink alcohol or use illicit drugs. family history is not on file. [name] [name] Known Allergies [name] [name] current outpatient prescriptions on file prior to visit.   [name] [name] current facility-administered medications on file prior to visit.   Review of Systems  Constitutional: Negative for unexpected weight change, or unusual diaphoresis  HENT: Negative for tinnitus.   Eyes: Negative for photophobia and visual disturbance.  Respiratory: Negative for choking and stridor.   Gastrointestinal: Negative for vomiting and blood in stool.  Genitourinary: Negative for hematuria and decreased urine volume.  Musculoskeletal: Negative for acute joint swelling Skin: Negative for color change and wound.  Neurological: Negative for tremors and numbness other than noted  Psychiatric/Behavioral:  Negative for decreased concentration or  hyperactivity.       Objective:   Physical Exam BP 140/80  Pulse 92  Temp(Src) 98.9 F (37.2 C) (Oral)  Ht 5\' 6"  (1.676 m)  Wt 160 lb 6 oz (72.746 kg)  BMI 25.9 kg/m2  SpO2 97% VS noted,  Constitutional: Pt appears well-developed and well-nourished.  HENT: Head: NCAT.  Right Ear: External ear normal.  Left Ear: External ear normal.  Eyes: Conjunctivae and EOM are normal. Pupils are equal, round, and reactive to light.  Bilat tm's with mild erythema.  Max sinus areas non tender.  Pharynx with mild erythema, [name] [name] exudate Neck: Normal range of motion. Neck supple.  Cardiovascular: Normal rate and regular rhythm.   Pulmonary/Chest: Effort normal and breath sounds normal.  Abd:  Soft, NT, non-distended, + BS Neurological: Pt is alert. Not confused  Skin: 4 x 6 cm area prox left ant thigh with nontender erythema, slight raised, nonfluctuant; also 4 separate bite like areas to left lower abd, right buttock and left prox thigh,the left abd one with mild erythema/tender Psychiatric: Pt behavior is normal. Thought content normal.     Assessment & Plan:

## 2012-06-24 NOTE — Patient Instructions (Signed)
You had the steroid shot today Please take all new medication as prescribed- the steroid cream and antibiotic Please continue all other medications as before

## 2012-08-28 ENCOUNTER — Ambulatory Visit: Payer: Medicare Other | Admitting: Internal Medicine

## 2012-11-12 ENCOUNTER — Ambulatory Visit (INDEPENDENT_AMBULATORY_CARE_PROVIDER_SITE_OTHER): Payer: Medicare Other

## 2012-11-12 DIAGNOSIS — Z23 Encounter for immunization: Secondary | ICD-10-CM

## 2013-12-10 ENCOUNTER — Telehealth: Payer: Self-pay | Admitting: Internal Medicine

## 2013-12-10 NOTE — Telephone Encounter (Signed)
Patient did not answer the telephone and he has not set up his voice mail.  So no message was left.

## 2014-06-02 DIAGNOSIS — R972 Elevated prostate specific antigen [PSA]: Secondary | ICD-10-CM | POA: Diagnosis not present

## 2014-06-09 DIAGNOSIS — N402 Nodular prostate without lower urinary tract symptoms: Secondary | ICD-10-CM | POA: Diagnosis not present

## 2014-06-09 DIAGNOSIS — R972 Elevated prostate specific antigen [PSA]: Secondary | ICD-10-CM | POA: Diagnosis not present

## 2014-07-22 ENCOUNTER — Encounter: Payer: Self-pay | Admitting: Internal Medicine

## 2014-07-22 ENCOUNTER — Ambulatory Visit (INDEPENDENT_AMBULATORY_CARE_PROVIDER_SITE_OTHER): Payer: Medicare Other | Admitting: Internal Medicine

## 2014-07-22 VITALS — BP 130/82 | HR 93 | Temp 98.5°F | Ht 66.0 in | Wt 160.0 lb

## 2014-07-22 DIAGNOSIS — J309 Allergic rhinitis, unspecified: Secondary | ICD-10-CM | POA: Diagnosis not present

## 2014-07-22 DIAGNOSIS — J209 Acute bronchitis, unspecified: Secondary | ICD-10-CM | POA: Diagnosis not present

## 2014-07-22 DIAGNOSIS — R972 Elevated prostate specific antigen [PSA]: Secondary | ICD-10-CM

## 2014-07-22 DIAGNOSIS — Z0000 Encounter for general adult medical examination without abnormal findings: Secondary | ICD-10-CM

## 2014-07-22 DIAGNOSIS — Z0189 Encounter for other specified special examinations: Secondary | ICD-10-CM

## 2014-07-22 DIAGNOSIS — Z0001 Encounter for general adult medical examination with abnormal findings: Secondary | ICD-10-CM | POA: Insufficient documentation

## 2014-07-22 MED ORDER — BENZONATATE 100 MG PO CAPS: ORAL_CAPSULE | ORAL | Status: DC

## 2014-07-22 MED ORDER — AZITHROMYCIN 250 MG PO TABS: ORAL_TABLET | ORAL | Status: DC

## 2014-07-22 MED ORDER — PREDNISONE 10 MG PO TABS: ORAL_TABLET | ORAL | Status: DC

## 2014-07-22 MED ORDER — ASPIRIN EC 81 MG PO TBEC: 81.0000 mg | DELAYED_RELEASE_TABLET | Freq: Every day | ORAL | Status: DC

## 2014-07-22 NOTE — Assessment & Plan Note (Signed)

## 2014-07-22 NOTE — Progress Notes (Signed)
Pre visit review using our clinic review tool, if applicable. No additional management support is needed unless otherwise documented below in the visit note. 

## 2014-07-22 NOTE — Assessment & Plan Note (Signed)
S/p biopsy x 2 - last > 17, for f/u with Dr Laverle Patter 3 mo as planned

## 2014-07-22 NOTE — Progress Notes (Signed)
Subjective:    Patient ID: [name] [name], male    DOB: 1945-11-01, 68 y.o.   MRN: [id number]  HPI Here for wellness and f/u;  Overall doing ok;  Pt denies Chest pain, worsening SOB, DOE, wheezing, orthopnea, PND, worsening LE edema, palpitations, dizziness or syncope.  Pt denies neurological change such as new headache, facial or extremity weakness.  Pt denies polydipsia, polyuria, or low sugar symptoms. Pt states overall good compliance with treatment and medications, good tolerability, and has been trying to follow appropriate diet.  Pt denies worsening depressive symptoms, suicidal ideation or panic. [name] fever, night sweats, wt loss, loss of appetite, or other constitutional symptoms.  Pt states good ability with ADL's, has low fall risk, home safety reviewed and adequate, [name] other significant changes in hearing or vision, and only occasionally active with exercise.  Not taking asa daily, stopped for some reason.  Incidentally - Here with acute onset mild to mod 2-3 days ST, HA, general weakness and malaise, with prod cough greenish sputum, but Pt denies chest pain, increased sob or doe, wheezing, orthopnea, PND, increased LE swelling, palpitations, dizziness or syncope. Also- Does have several wks ongoing nasal allergy symptoms with clearish congestion, itch and sneezing, without fever, pain, ST, cough, swelling or wheezing.  Sees urology every 3-6 mo - S/p prostate biopsy x 2 - neg, most recent psa 17.  Declines prevnar. [name] other complaints Past Medical History  Diagnosis Date  . Umbilical hernia   . Nephrolithiasis   . GERD (gastroesophageal reflux disease)   . Hypertension   . Diabetes mellitus   . Osteoarthritis    Past Surgical History  Procedure Laterality Date  . Vasectomy    . Hernia repair      [facility]  . Hernia repair  12/06/10    umbilical hernia     reports that he has never smoked. He has never used smokeless tobacco. He reports that he does not drink alcohol or use illicit  drugs. family history is not on file. [name] Known Allergies [name] current outpatient prescriptions on file prior to visit.   [name] current facility-administered medications on file prior to visit.    Review of Systems Constitutional: Negative for increased diaphoresis, other activity, appetite or siginficant weight change other than noted HENT: Negative for worsening hearing loss, ear pain, facial swelling, mouth sores and neck stiffness.   Eyes: Negative for other worsening pain, redness or visual disturbance.  Respiratory: Negative for shortness of breath and wheezing  Cardiovascular: Negative for chest pain and palpitations.  Gastrointestinal: Negative for diarrhea, blood in stool, abdominal distention or other pain Genitourinary: Negative for hematuria, flank pain or change in urine volume.  Musculoskeletal: Negative for myalgias or other joint complaints.  Skin: Negative for color change and wound or drainage.  Neurological: Negative for syncope and numbness. other than noted Hematological: Negative for adenopathy. or other swelling Psychiatric/Behavioral: Negative for hallucinations, SI, self-injury, decreased concentration or other worsening agitation.      Objective:   Physical Exam BP 130/82 mmHg  Pulse 93  Temp(Src) 98.5 F (36.9 C) (Oral)  Ht 5\' 6"  (1.676 m)  Wt 160 lb (72.576 kg)  BMI 25.84 kg/m2  SpO2 98% VS noted,  Constitutional: Pt is oriented to person, place, and time. Appears well-developed and well-nourished, in [name] significant distress Head: Normocephalic and atraumatic.  Right Ear: External ear normal.  Left Ear: External ear normal.  Nose: Nose normal.  Bilat tm's with mild erythema.  Max sinus areas  mild tender.  Pharynx with mild erythema, [name] exudate Mouth/Throat: Oropharynx is clear and moist.  Eyes: Conjunctivae and EOM are normal. Pupils are equal, round, and reactive to light.  Neck: Normal range of motion. Neck supple. [name] JVD present. [name] tracheal  deviation present or significant neck LA or mass Cardiovascular: Normal rate, regular rhythm, normal heart sounds and intact distal pulses.   Pulmonary/Chest: Effort normal and breath sounds without rales or wheezing  Abdominal: Soft. Bowel sounds are normal. NT. [name] HSM  Musculoskeletal: Normal range of motion. Exhibits [name] edema.  Lymphadenopathy:  Has [name] cervical adenopathy.  Neurological: Pt is alert and oriented to person, place, and time. Pt has normal reflexes. [name] cranial nerve deficit. Motor grossly intact Skin: Skin is warm and dry. [name] rash noted.  Psychiatric:  Has mild nervous mood and affect. Behavior is normal.     Assessment & Plan:

## 2014-07-22 NOTE — Assessment & Plan Note (Signed)
Mild to mod, for antibx course,  to f/u any worsening symptoms or concerns 

## 2014-07-22 NOTE — Patient Instructions (Addendum)
Please take all new medication as prescribed - the antibiotic, cough pills, and prednisone  Please also start zyrtec OTC, and Nasacort OTC if the nasal congestoin reoccurs after the prednisone  Please also start Aspirin 81 mg (coated only) - 1 per day to help reduce risk of stroke and heart disease  Please continue all other medications as before, and refills have been done if requested.  Please have the pharmacy call with any other refills you may need.  Please continue your efforts at being more active, low cholesterol diet, and weight control.  Please keep your appointments with your specialists as you may have planned  Please go to the LAB in the Basement (turn left off the elevator) for the tests to be done tomorrow  You will be contacted by phone if any changes need to be made immediately.  Otherwise, you will receive a letter about your results with an explanation, but please check with MyChart first.  Please remember to sign up for MyChart if you have not done so, as this will be important to you in the future with finding out test results, communicating by private email, and scheduling acute appointments online when needed.  Please return in 1 year for your yearly visit, or sooner if needed, with Lab testing done 3-5 days before

## 2014-07-22 NOTE — Assessment & Plan Note (Signed)
Rather sever flare - for low dose short course prednisone, then zyrtec/nasacort otc,  to f/u any worsening symptoms or concerns

## 2014-07-27 ENCOUNTER — Encounter: Payer: Self-pay | Admitting: Internal Medicine

## 2014-07-27 ENCOUNTER — Other Ambulatory Visit (INDEPENDENT_AMBULATORY_CARE_PROVIDER_SITE_OTHER): Payer: Medicare Other

## 2014-07-27 DIAGNOSIS — Z0189 Encounter for other specified special examinations: Secondary | ICD-10-CM | POA: Diagnosis not present

## 2014-07-27 DIAGNOSIS — Z0000 Encounter for general adult medical examination without abnormal findings: Secondary | ICD-10-CM

## 2014-07-27 LAB — URINALYSIS, ROUTINE W REFLEX MICROSCOPIC
BILIRUBIN URINE: NEGATIVE
Hgb urine dipstick: NEGATIVE
Ketones, ur: NEGATIVE
LEUKOCYTES UA: NEGATIVE
Nitrite: NEGATIVE
RBC / HPF: NONE SEEN (ref 0–?)
Specific Gravity, Urine: 1.015 (ref 1.000–1.030)
Total Protein, Urine: NEGATIVE
URINE GLUCOSE: NEGATIVE
UROBILINOGEN UA: 0.2 (ref 0.0–1.0)
pH: 5.5 (ref 5.0–8.0)

## 2014-07-27 LAB — CBC WITH DIFFERENTIAL/PLATELET
BASOS ABS: 0.1 10*3/uL (ref 0.0–0.1)
Basophils Relative: 0.7 % (ref 0.0–3.0)
Eosinophils Absolute: 0.1 10*3/uL (ref 0.0–0.7)
Eosinophils Relative: 1.3 % (ref 0.0–5.0)
HCT: 42.4 % (ref 39.0–52.0)
Hemoglobin: 14.3 g/dL (ref 13.0–17.0)
Lymphocytes Relative: 33.7 % (ref 12.0–46.0)
Lymphs Abs: 2.4 10*3/uL (ref 0.7–4.0)
MCHC: 33.8 g/dL (ref 30.0–36.0)
MCV: 93.1 fl (ref 78.0–100.0)
MONOS PCT: 9.2 % (ref 3.0–12.0)
Monocytes Absolute: 0.7 10*3/uL (ref 0.1–1.0)
NEUTROS PCT: 55.1 % (ref 43.0–77.0)
Neutro Abs: 3.9 10*3/uL (ref 1.4–7.7)
PLATELETS: 241 10*3/uL (ref 150.0–400.0)
RBC: 4.55 Mil/uL (ref 4.22–5.81)
RDW: 13.1 % (ref 11.5–15.5)
WBC: 7.1 10*3/uL (ref 4.0–10.5)

## 2014-07-27 LAB — BASIC METABOLIC PANEL
BUN: 14 mg/dL (ref 6–23)
CALCIUM: 9 mg/dL (ref 8.4–10.5)
CO2: 25 meq/L (ref 19–32)
Chloride: 106 mEq/L (ref 96–112)
Creatinine, Ser: 0.9 mg/dL (ref 0.40–1.50)
GFR: 89.27 mL/min (ref >60.00)
Glucose, Bld: 106 mg/dL — ABNORMAL HIGH (ref 70–99)
Potassium: 3.4 mEq/L — ABNORMAL LOW (ref 3.5–5.1)
Sodium: 140 mEq/L (ref 135–145)

## 2014-07-27 LAB — LIPID PANEL
CHOLESTEROL: 179 mg/dL (ref 0–200)
HDL: 33.5 mg/dL — ABNORMAL LOW (ref >39.00)
LDL CALC: 117 mg/dL — AB (ref 0–99)
NonHDL: 145.5
TRIGLYCERIDES: 144 mg/dL (ref 0.0–149.0)
Total CHOL/HDL Ratio: 5
VLDL: 28.8 mg/dL (ref 0.0–40.0)

## 2014-07-27 LAB — HEPATIC FUNCTION PANEL
ALBUMIN: 3.7 g/dL (ref 3.5–5.2)
ALT: 10 U/L (ref 0–53)
AST: 12 U/L (ref 0–37)
Alkaline Phosphatase: 59 U/L (ref 39–117)
BILIRUBIN DIRECT: 0.1 mg/dL (ref 0.0–0.3)
BILIRUBIN TOTAL: 0.7 mg/dL (ref 0.2–1.2)
TOTAL PROTEIN: 6.6 g/dL (ref 6.0–8.3)

## 2014-07-27 LAB — TSH: TSH: 0.62 u[IU]/mL (ref 0.35–4.50)

## 2014-09-09 DIAGNOSIS — R972 Elevated prostate specific antigen [PSA]: Secondary | ICD-10-CM | POA: Diagnosis not present

## 2014-12-02 DIAGNOSIS — R109 Unspecified abdominal pain: Secondary | ICD-10-CM | POA: Diagnosis not present

## 2014-12-02 DIAGNOSIS — R3129 Other microscopic hematuria: Secondary | ICD-10-CM | POA: Diagnosis not present

## 2014-12-02 DIAGNOSIS — N200 Calculus of kidney: Secondary | ICD-10-CM | POA: Diagnosis not present

## 2014-12-02 DIAGNOSIS — N390 Urinary tract infection, site not specified: Secondary | ICD-10-CM | POA: Diagnosis not present

## 2014-12-02 DIAGNOSIS — N1330 Unspecified hydronephrosis: Secondary | ICD-10-CM | POA: Diagnosis not present

## 2014-12-17 DIAGNOSIS — R972 Elevated prostate specific antigen [PSA]: Secondary | ICD-10-CM | POA: Diagnosis not present

## 2014-12-24 DIAGNOSIS — N200 Calculus of kidney: Secondary | ICD-10-CM | POA: Diagnosis not present

## 2015-04-26 ENCOUNTER — Telehealth: Payer: Self-pay

## 2015-04-26 NOTE — Telephone Encounter (Signed)
Patient called to educate on Medicare Wellness apt. LVM for the patient to call back to educate and schedule for wellness visit.   

## 2015-06-15 ENCOUNTER — Ambulatory Visit (INDEPENDENT_AMBULATORY_CARE_PROVIDER_SITE_OTHER): Payer: Medicare Other | Admitting: Internal Medicine

## 2015-06-15 ENCOUNTER — Encounter: Payer: Self-pay | Admitting: Internal Medicine

## 2015-06-15 VITALS — BP 140/80 | HR 84 | Temp 98.3°F | Resp 20 | Wt 161.0 lb

## 2015-06-15 DIAGNOSIS — L089 Local infection of the skin and subcutaneous tissue, unspecified: Secondary | ICD-10-CM

## 2015-06-15 DIAGNOSIS — Z0001 Encounter for general adult medical examination with abnormal findings: Secondary | ICD-10-CM | POA: Diagnosis not present

## 2015-06-15 DIAGNOSIS — T148 Other injury of unspecified body region: Secondary | ICD-10-CM

## 2015-06-15 DIAGNOSIS — R42 Dizziness and giddiness: Secondary | ICD-10-CM | POA: Insufficient documentation

## 2015-06-15 DIAGNOSIS — W57XXXA Bitten or stung by nonvenomous insect and other nonvenomous arthropods, initial encounter: Secondary | ICD-10-CM | POA: Insufficient documentation

## 2015-06-15 DIAGNOSIS — R6889 Other general symptoms and signs: Secondary | ICD-10-CM | POA: Diagnosis not present

## 2015-06-15 DIAGNOSIS — R739 Hyperglycemia, unspecified: Secondary | ICD-10-CM | POA: Diagnosis not present

## 2015-06-15 DIAGNOSIS — J309 Allergic rhinitis, unspecified: Secondary | ICD-10-CM | POA: Diagnosis not present

## 2015-06-15 DIAGNOSIS — Z1159 Encounter for screening for other viral diseases: Secondary | ICD-10-CM

## 2015-06-15 MED ORDER — TRIAMCINOLONE ACETONIDE 55 MCG/ACT NA AERO: 2.0000 | INHALATION_SPRAY | Freq: Every day | NASAL | Status: DC

## 2015-06-15 MED ORDER — MECLIZINE HCL 12.5 MG PO TABS: 12.5000 mg | ORAL_TABLET | Freq: Three times a day (TID) | ORAL | Status: DC | PRN

## 2015-06-15 MED ORDER — DOXYCYCLINE HYCLATE 100 MG PO TABS: 100.0000 mg | ORAL_TABLET | Freq: Two times a day (BID) | ORAL | Status: DC

## 2015-06-15 MED ORDER — CETIRIZINE HCL 10 MG PO TABS: 10.0000 mg | ORAL_TABLET | Freq: Every day | ORAL | Status: DC

## 2015-06-15 NOTE — Progress Notes (Signed)
Subjective:    Patient ID: [name] [name] [name], male    DOB: 27-Aug-1945, 70 y.o.   MRN: [id number]  HPI  Here for wellness and f/u;  Overall doing ok;  Pt denies Chest pain, worsening SOB, DOE, wheezing, orthopnea, PND, worsening LE edema, palpitations, dizziness or syncope.  Pt denies neurological change such as new headache, facial or extremity weakness.  Pt denies polydipsia, polyuria, or low sugar symptoms. Pt states overall good compliance with treatment and medications, good tolerability, and has been trying to follow appropriate diet.  Pt denies worsening depressive symptoms, suicidal ideation or panic. [name] [name] fever, night sweats, wt loss, loss of appetite, or other constitutional symptoms.  Pt states good ability with ADL's, has low fall risk, home safety reviewed and adequate, [name] [name] other significant changes in hearing or vision, and only occasionally active with exercise. Also with recent finding of 3 ticks off in the past 2 wks to various areas, right back, left buttock and left lower leg, 2 with increased redness/tender/sweling but [name] [name] drainage or red streaks, [name] [name] high fever or rash.. Does have several wks ongoing nasal allergy symptoms with clearish congestion, itch and sneezing, without fever, pain, ST, cough, swelling or wheezing. Also last Sat with episode transient dizziness with bedngin working on car and stood up, last seconds only, but still some dizzines andnausea since then with head movement.  Left ear popped and crackled then, none since he is aware.  Declines all immunization adn colonoscopy  Pt denies polydipsia, polyuria. Past Medical History  Diagnosis Date  . Umbilical hernia   . Nephrolithiasis   . GERD (gastroesophageal reflux disease)   . Hypertension   . Diabetes mellitus   . Osteoarthritis    Past Surgical History  Procedure Laterality Date  . Vasectomy    . Hernia repair      [facility]  . Hernia repair  12/06/10    umbilical hernia     reports that he has never smoked. He has  never used smokeless tobacco. He reports that he does not drink alcohol or use illicit drugs. family history is not on file. [name] [name] Known Allergies Current Outpatient Prescriptions on File Prior to Visit  Medication Sig Dispense Refill  . aspirin EC 81 MG tablet Take 1 tablet (81 mg total) by mouth daily. (Patient not taking: Reported on 06/15/2015) 90 tablet 11   [name] [name] current facility-administered medications on file prior to visit.    Review of Systems Constitutional: Negative for increased diaphoresis, or other activity, appetite or siginficant weight change other than noted HENT: Negative for worsening hearing loss, ear pain, facial swelling, mouth sores and neck stiffness.   Eyes: Negative for other worsening pain, redness or visual disturbance.  Respiratory: Negative for choking or stridor Cardiovascular: Negative for other chest pain and palpitations.  Gastrointestinal: Negative for worsening diarrhea, blood in stool, or abdominal distention Genitourinary: Negative for hematuria, flank pain or change in urine volume.  Musculoskeletal: Negative for myalgias or other joint complaints.  Skin: Negative for other color change and wound or drainage.  Neurological: Negative for syncope and numbness. other than noted Hematological: Negative for adenopathy. or other swelling Psychiatric/Behavioral: Negative for hallucinations, SI, self-injury, decreased concentration or other worsening agitation.      Objective:   Physical Exam BP 140/80 mmHg  Pulse 84  Temp(Src) 98.3 F (36.8 C) (Oral)  Resp 20  Wt 161 lb (73.029 kg)  SpO2 97% VS noted,  Constitutional: Pt is oriented to person, place, and time. Appears well-developed  and well-nourished, in [name] [name] significant distress Head: Normocephalic and atraumatic  Eyes: Conjunctivae and EOM are normal. Pupils are equal, round, and reactive to light Right Ear: External ear normal.  Left Ear: External ear normal Nose: Nose normal.  Mouth/Throat:  Oropharynx is clear and moist  Bilat tm's with mild erythema.  Max sinus areas mild tender.  Pharynx with mild erythema, [name] [name] exudate Neck: Normal range of motion. Neck supple. [name] [name] JVD present. [name] [name] tracheal deviation present or significant neck LA or mass Cardiovascular: Normal rate, regular rhythm, normal heart sounds and intact distal pulses.   Pulmonary/Chest: Effort normal and breath sounds without rales or wheezing  Abdominal: Soft. Bowel sounds are normal. NT. [name] [name] HSM  Musculoskeletal: Normal range of motion. Exhibits [name] [name] edema Lymphadenopathy: Has [name] [name] cervical adenopathy.  Neurological: Pt is alert and oriented to person, place, and time. Pt has normal reflexes. [name] [name] cranial nerve deficit. Motor grossly intact Skin: Skin is warm and dry. [name] [name] rash noted or new ulcers, but 2 tick bite areas with mild erythema, tender, swelling Psychiatric:  Has normal mood and affect. Behavior is normal.     Assessment & Plan:

## 2015-06-15 NOTE — Patient Instructions (Addendum)
You had the steroid shot today  Please take all new medication as prescribed - the antibiotic, zyrtec, nasacort and meclizine as needed  Please continue all other medications as before, and refills have been done if requested.  Please have the pharmacy call with any other refills you may need.  Please continue your efforts at being more active, low cholesterol diet, and weight control.  You are otherwise up to date with prevention measures today.  Please keep your appointments with your specialists as you may have planned  Please go to the LAB in the Basement (turn left off the elevator) for the tests to be done at your convenience  You will be contacted by phone if any changes need to be made immediately.  Otherwise, you will receive a letter about your results with an explanation, but please check with MyChart first.  Please remember to sign up for MyChart if you have not done so, as this will be important to you in the future with finding out test results, communicating by private email, and scheduling acute appointments online when needed.  Please return in 1 year for your yearly visit, or sooner if needed

## 2015-06-15 NOTE — Progress Notes (Signed)
Pre visit review using our clinic review tool, if applicable. No additional management support is needed unless otherwise documented below in the visit note. 

## 2015-06-17 ENCOUNTER — Encounter: Payer: Self-pay | Admitting: Internal Medicine

## 2015-06-17 ENCOUNTER — Other Ambulatory Visit: Payer: Self-pay | Admitting: Internal Medicine

## 2015-06-17 ENCOUNTER — Other Ambulatory Visit (INDEPENDENT_AMBULATORY_CARE_PROVIDER_SITE_OTHER): Payer: Medicare Other

## 2015-06-17 DIAGNOSIS — Z0189 Encounter for other specified special examinations: Secondary | ICD-10-CM

## 2015-06-17 DIAGNOSIS — Z1159 Encounter for screening for other viral diseases: Secondary | ICD-10-CM | POA: Diagnosis not present

## 2015-06-17 DIAGNOSIS — R739 Hyperglycemia, unspecified: Secondary | ICD-10-CM

## 2015-06-17 DIAGNOSIS — Z0000 Encounter for general adult medical examination without abnormal findings: Secondary | ICD-10-CM

## 2015-06-17 LAB — TSH: TSH: 0.69 u[IU]/mL (ref 0.35–4.50)

## 2015-06-17 LAB — BASIC METABOLIC PANEL
BUN: 14 mg/dL (ref 6–23)
CO2: 25 meq/L (ref 19–32)
CREATININE: 0.85 mg/dL (ref 0.40–1.50)
Calcium: 8.9 mg/dL (ref 8.4–10.5)
Chloride: 104 mEq/L (ref 96–112)
GFR: 95.11 mL/min (ref >60.00)
Glucose, Bld: 168 mg/dL — ABNORMAL HIGH (ref 70–99)
POTASSIUM: 4 meq/L (ref 3.5–5.1)
Sodium: 138 mEq/L (ref 135–145)

## 2015-06-17 LAB — CBC WITH DIFFERENTIAL/PLATELET
BASOS PCT: 0.5 % (ref 0.0–3.0)
Basophils Absolute: 0 10*3/uL (ref 0.0–0.1)
EOS PCT: 5.1 % — AB (ref 0.0–5.0)
Eosinophils Absolute: 0.3 10*3/uL (ref 0.0–0.7)
HEMATOCRIT: 43.2 % (ref 39.0–52.0)
HEMOGLOBIN: 14.6 g/dL (ref 13.0–17.0)
LYMPHS PCT: 22.7 % (ref 12.0–46.0)
Lymphs Abs: 1.4 10*3/uL (ref 0.7–4.0)
MCHC: 33.8 g/dL (ref 30.0–36.0)
MCV: 92.2 fl (ref 78.0–100.0)
MONO ABS: 0.6 10*3/uL (ref 0.1–1.0)
Monocytes Relative: 9.2 % (ref 3.0–12.0)
Neutro Abs: 3.8 10*3/uL (ref 1.4–7.7)
Neutrophils Relative %: 62.5 % (ref 43.0–77.0)
Platelets: 261 10*3/uL (ref 150.0–400.0)
RBC: 4.68 Mil/uL (ref 4.22–5.81)
RDW: 13 % (ref 11.5–15.5)
WBC: 6.1 10*3/uL (ref 4.0–10.5)

## 2015-06-17 LAB — HEPATIC FUNCTION PANEL
ALT: 9 U/L (ref 0–53)
AST: 12 U/L (ref 0–37)
Albumin: 4 g/dL (ref 3.5–5.2)
Alkaline Phosphatase: 74 U/L (ref 39–117)
BILIRUBIN TOTAL: 0.6 mg/dL (ref 0.2–1.2)
Bilirubin, Direct: 0.1 mg/dL (ref 0.0–0.3)
Total Protein: 7 g/dL (ref 6.0–8.3)

## 2015-06-17 LAB — URINALYSIS, ROUTINE W REFLEX MICROSCOPIC
BILIRUBIN URINE: NEGATIVE
Ketones, ur: NEGATIVE
Leukocytes, UA: NEGATIVE
NITRITE: NEGATIVE
Specific Gravity, Urine: 1.02 (ref 1.000–1.030)
Total Protein, Urine: NEGATIVE
Urine Glucose: NEGATIVE
Urobilinogen, UA: 0.2 (ref 0.0–1.0)
pH: 5.5 (ref 5.0–8.0)

## 2015-06-17 LAB — LIPID PANEL
CHOL/HDL RATIO: 6
CHOLESTEROL: 187 mg/dL (ref 0–200)
HDL: 31.3 mg/dL — AB (ref >39.00)
LDL Cholesterol: 139 mg/dL — ABNORMAL HIGH (ref 0–99)
NonHDL: 156.03
TRIGLYCERIDES: 84 mg/dL (ref 0.0–149.0)
VLDL: 16.8 mg/dL (ref 0.0–40.0)

## 2015-06-17 LAB — HEMOGLOBIN A1C: HEMOGLOBIN A1C: 6.2 % (ref 4.6–6.5)

## 2015-06-17 MED ORDER — ATORVASTATIN CALCIUM 10 MG PO TABS: 10.0000 mg | ORAL_TABLET | Freq: Every day | ORAL | Status: DC

## 2015-06-18 ENCOUNTER — Telehealth: Payer: Self-pay | Admitting: Internal Medicine

## 2015-06-18 LAB — HEPATITIS C ANTIBODY: HCV AB: NEGATIVE

## 2015-06-18 NOTE — Telephone Encounter (Signed)
Pt came in on Saturday stating that he missed a call from office. Informed pt that call was in regards to lab per note in EPIC. Pt says that he will call back in on Monday to follow up.

## 2015-06-20 DIAGNOSIS — R739 Hyperglycemia, unspecified: Secondary | ICD-10-CM | POA: Insufficient documentation

## 2015-06-20 DIAGNOSIS — R7303 Prediabetes: Secondary | ICD-10-CM | POA: Insufficient documentation

## 2015-06-20 NOTE — Assessment & Plan Note (Signed)
stable overall by history and exam, recent data reviewed with pt, and pt to continue medical treatment as before,  to f/u any worsening symptoms or concerns Lab Results  Component Value Date   HGBA1C 6.2 06/17/2015

## 2015-06-20 NOTE — Assessment & Plan Note (Signed)
Mild to mod, for antibx course,  to f/u any worsening symptoms or concerns 

## 2015-06-20 NOTE — Assessment & Plan Note (Signed)

## 2015-06-20 NOTE — Assessment & Plan Note (Signed)
Mild to mod, likely peripheral, for meclizine prn,  to f/u any worsening symptoms or concerns 

## 2015-06-20 NOTE — Assessment & Plan Note (Addendum)
Mild to mod, for depomedrol IM, zyrtec abd nasacort asd,  to f/u any worsening symptoms or concerns  In addition to the time spent performing CPE, I spent an additional 40 minutes face to face,in which greater than 50% of this time was spent in counseling and coordination of care for patient's acute illness as documented.

## 2015-06-22 DIAGNOSIS — R972 Elevated prostate specific antigen [PSA]: Secondary | ICD-10-CM | POA: Diagnosis not present

## 2015-06-29 DIAGNOSIS — R972 Elevated prostate specific antigen [PSA]: Secondary | ICD-10-CM | POA: Diagnosis not present

## 2015-06-29 DIAGNOSIS — Z0000 Encounter for general adult medical examination without abnormal findings: Secondary | ICD-10-CM | POA: Diagnosis not present

## 2015-06-29 DIAGNOSIS — N200 Calculus of kidney: Secondary | ICD-10-CM | POA: Diagnosis not present

## 2015-07-08 ENCOUNTER — Telehealth: Payer: Self-pay | Admitting: Internal Medicine

## 2015-07-08 NOTE — Telephone Encounter (Signed)
Got scheduled  °

## 2015-07-08 NOTE — Telephone Encounter (Signed)
Ok for sat clinic, may just be irritated, may not need antibx?

## 2015-07-08 NOTE — Telephone Encounter (Signed)
Patient states he has red ring around tick bite and has finished antibiotic.  States he does not have fever and bite is not painful.  He would like to know if Dr. Jonny RuizJohn wanted to call in another round of antibiotics or should schedule for Saturday Clinic?

## 2015-07-09 ENCOUNTER — Ambulatory Visit (INDEPENDENT_AMBULATORY_CARE_PROVIDER_SITE_OTHER): Payer: Medicare Other | Admitting: Family Medicine

## 2015-07-09 ENCOUNTER — Encounter: Payer: Self-pay | Admitting: Family Medicine

## 2015-07-09 VITALS — BP 130/82 | HR 63 | Temp 98.1°F | Resp 16 | Wt 158.0 lb

## 2015-07-09 DIAGNOSIS — R21 Rash and other nonspecific skin eruption: Secondary | ICD-10-CM | POA: Diagnosis not present

## 2015-07-09 MED ORDER — DOXYCYCLINE HYCLATE 100 MG PO TABS: 100.0000 mg | ORAL_TABLET | Freq: Two times a day (BID) | ORAL | Status: DC

## 2015-07-09 MED ORDER — TRIAMCINOLONE ACETONIDE 0.1 % EX CREA: 1.0000 "application " | TOPICAL_CREAM | Freq: Two times a day (BID) | CUTANEOUS | Status: DC

## 2015-07-09 NOTE — Patient Instructions (Signed)
Restart doxycycline, careful for sun exposure.  Use the cream on the itchy areas.  Take care.  Glad to see you.

## 2015-07-09 NOTE — Assessment & Plan Note (Signed)
Atypical for cellulitis, but with the tick bites on B legs, would retreat.  Use TAC for the itching.  F/u prn.  Nontoxic.  D/w pt.

## 2015-07-09 NOTE — Progress Notes (Signed)
Pre visit review using our clinic review tool, if applicable. No additional management support is needed unless otherwise documented below in the visit note.  Not yet on statin.    Ticks prev removed.  Done with prev doxy course.  2 lesions resolved but 1 site with surrounding erythema.  Not painful.  Is itchy.  The rash started a few days ago.  No fevers.  He doesn't feel sick.    Meds, vitals, and allergies reviewed.   ROS: Per HPI unless specifically indicated in ROS section   nad ncat Mmm Neck supple.  No LA rrr ctab Blanching rash about 2.5" across the tick bite site on the L medial thigh. Some central clearing.   Similar rash w/o central clearing on the R medial thigh Ext w/o edema.

## 2016-03-19 ENCOUNTER — Telehealth: Payer: Self-pay | Admitting: Internal Medicine

## 2016-03-19 NOTE — Telephone Encounter (Signed)
Called patient to schedule awv. Lvm for patient to call office to schedule appt.  °

## 2016-07-11 ENCOUNTER — Telehealth: Payer: Self-pay | Admitting: Internal Medicine

## 2016-07-11 NOTE — Telephone Encounter (Signed)
Called pt regarding awv. Pt stated that he will need to call office back to schedule an appt.

## 2016-09-21 ENCOUNTER — Other Ambulatory Visit (INDEPENDENT_AMBULATORY_CARE_PROVIDER_SITE_OTHER): Payer: Medicare Other

## 2016-09-21 ENCOUNTER — Ambulatory Visit (INDEPENDENT_AMBULATORY_CARE_PROVIDER_SITE_OTHER): Payer: Medicare Other | Admitting: Internal Medicine

## 2016-09-21 ENCOUNTER — Encounter: Payer: Self-pay | Admitting: Internal Medicine

## 2016-09-21 VITALS — BP 130/86 | HR 67 | Temp 98.6°F | Ht 66.0 in | Wt 155.0 lb

## 2016-09-21 DIAGNOSIS — E785 Hyperlipidemia, unspecified: Secondary | ICD-10-CM | POA: Diagnosis not present

## 2016-09-21 DIAGNOSIS — R972 Elevated prostate specific antigen [PSA]: Secondary | ICD-10-CM

## 2016-09-21 DIAGNOSIS — R739 Hyperglycemia, unspecified: Secondary | ICD-10-CM

## 2016-09-21 DIAGNOSIS — Z0000 Encounter for general adult medical examination without abnormal findings: Secondary | ICD-10-CM

## 2016-09-21 DIAGNOSIS — Z23 Encounter for immunization: Secondary | ICD-10-CM

## 2016-09-21 LAB — CBC WITH DIFFERENTIAL/PLATELET
BASOS PCT: 1 % (ref 0.0–3.0)
Basophils Absolute: 0.1 10*3/uL (ref 0.0–0.1)
EOS PCT: 5.3 % — AB (ref 0.0–5.0)
Eosinophils Absolute: 0.4 10*3/uL (ref 0.0–0.7)
HCT: 44.1 % (ref 39.0–52.0)
Hemoglobin: 15 g/dL (ref 13.0–17.0)
LYMPHS ABS: 1.6 10*3/uL (ref 0.7–4.0)
Lymphocytes Relative: 20.3 % (ref 12.0–46.0)
MCHC: 34.1 g/dL (ref 30.0–36.0)
MCV: 95.5 fl (ref 78.0–100.0)
MONO ABS: 0.6 10*3/uL (ref 0.1–1.0)
Monocytes Relative: 8.4 % (ref 3.0–12.0)
NEUTROS ABS: 5 10*3/uL (ref 1.4–7.7)
NEUTROS PCT: 65 % (ref 43.0–77.0)
PLATELETS: 242 10*3/uL (ref 150.0–400.0)
RBC: 4.62 Mil/uL (ref 4.22–5.81)
RDW: 13.2 % (ref 11.5–15.5)
WBC: 7.6 10*3/uL (ref 4.0–10.5)

## 2016-09-21 LAB — HEPATIC FUNCTION PANEL
ALT: 9 U/L (ref 0–53)
AST: 12 U/L (ref 0–37)
Albumin: 3.7 g/dL (ref 3.5–5.2)
Alkaline Phosphatase: 68 U/L (ref 39–117)
BILIRUBIN TOTAL: 0.6 mg/dL (ref 0.2–1.2)
Bilirubin, Direct: 0.1 mg/dL (ref 0.0–0.3)
Total Protein: 6.2 g/dL (ref 6.0–8.3)

## 2016-09-21 LAB — LIPID PANEL
CHOLESTEROL: 159 mg/dL (ref 0–200)
HDL: 35.4 mg/dL — AB (ref >39.00)
LDL Cholesterol: 102 mg/dL — ABNORMAL HIGH (ref 0–99)
NonHDL: 123.27
TRIGLYCERIDES: 105 mg/dL (ref 0.0–149.0)
Total CHOL/HDL Ratio: 4
VLDL: 21 mg/dL (ref 0.0–40.0)

## 2016-09-21 LAB — URINALYSIS, ROUTINE W REFLEX MICROSCOPIC
Bilirubin Urine: NEGATIVE
Hgb urine dipstick: NEGATIVE
KETONES UR: NEGATIVE
Leukocytes, UA: NEGATIVE
NITRITE: NEGATIVE
RBC / HPF: NONE SEEN (ref 0–?)
SPECIFIC GRAVITY, URINE: 1.01 (ref 1.000–1.030)
Total Protein, Urine: NEGATIVE
URINE GLUCOSE: NEGATIVE
UROBILINOGEN UA: 0.2 (ref 0.0–1.0)
WBC, UA: NONE SEEN (ref 0–?)
pH: 6 (ref 5.0–8.0)

## 2016-09-21 LAB — BASIC METABOLIC PANEL
BUN: 14 mg/dL (ref 6–23)
CHLORIDE: 106 meq/L (ref 96–112)
CO2: 27 mEq/L (ref 19–32)
CREATININE: 0.88 mg/dL (ref 0.40–1.50)
Calcium: 8.6 mg/dL (ref 8.4–10.5)
GFR: 91.04 mL/min (ref >60.00)
Glucose, Bld: 109 mg/dL — ABNORMAL HIGH (ref 70–99)
POTASSIUM: 4 meq/L (ref 3.5–5.1)
Sodium: 140 mEq/L (ref 135–145)

## 2016-09-21 LAB — HEMOGLOBIN A1C: Hgb A1c MFr Bld: 5.9 % (ref 4.6–6.5)

## 2016-09-21 LAB — PSA: PSA: 14.2 ng/mL — AB (ref 0.10–4.00)

## 2016-09-21 LAB — TSH: TSH: 0.98 u[IU]/mL (ref 0.35–4.50)

## 2016-09-21 NOTE — Patient Instructions (Addendum)
You had the Prevnar pneumonia shot today  Please call or let us know if you change your mind about the colonoscopy or cologuard   Please continue all other medications as before, and refills have been done if requested.  Please have the pharmacy call with any other refills you may need.  Please continue your efforts at being more active, low cholesterol diet, and weight control.  You are otherwise up to date with prevention measures today.  Please keep your appointments with your specialists as you may have planned  Please go to the LAB in the Basement (turn left off the elevator) for the tests to be done today  You will be contacted by phone if any changes need to be made immediately.  Otherwise, you will receive a letter about your results with an explanation, but please check with MyChart first.  Please remember to sign up for MyChart if you have not done so, as this will be important to you in the future with finding out test results, communicating by private email, and scheduling acute appointments online when needed.  Please return in 1 year for your yearly visit, or sooner if needed, with Lab testing done 3-5 days before

## 2016-09-21 NOTE — Progress Notes (Signed)
Subjective:    Patient ID: Fontaine No, male    DOB: 13-Oct-1945, 71 y.o.   MRN: [id number]  HPI  Here for wellness and f/u;  Overall doing ok;  Pt denies Chest pain, worsening SOB, DOE, wheezing, orthopnea, PND, worsening LE edema, palpitations, dizziness or syncope.  Pt denies neurological change such as new headache, facial or extremity weakness.  Pt denies polydipsia, polyuria, or low sugar symptoms. Pt states overall good compliance with treatment and medications, good tolerability, and has been trying to follow appropriate diet.  Pt denies worsening depressive symptoms, suicidal ideation or panic. No fever, night sweats, wt loss, loss of appetite, or other constitutional symptoms.  Pt states good ability with ADL's, has low fall risk, home safety reviewed and adequate, no other significant changes in hearing or vision, and only occasionally active with exercise.  Has lost some wt intentionally since last labs.  Did not take the statin more than one month as "made me feel funny inside" Wt Readings from Last 3 Encounters:  09/21/16 155 lb (70.3 kg)  07/09/15 158 lb (71.7 kg)  06/15/15 161 lb (73 kg)  Is s/p prostate biopsy neg x 2, did not return in May 2018 as he could not pay the PSA out of pocket.  Has had elevated PSA , may be some improved lately, has been about 12-14 recently, has been up to over 17 in the past. Past Medical History:  Diagnosis Date  . Diabetes mellitus   . GERD (gastroesophageal reflux disease)   . Hypertension   . Nephrolithiasis   . Osteoarthritis   . Umbilical hernia    Past Surgical History:  Procedure Laterality Date  . HERNIA REPAIR     LIH  . HERNIA REPAIR  12/06/10   umbilical hernia   . VASECTOMY      reports that he has never smoked. He has never used smokeless tobacco. He reports that he does not drink alcohol or use drugs. family history is not on file. Allergies  Allergen Reactions  . Lipitor [Atorvastatin] Other (See Comments)    "feels  funny inside"   Current Outpatient Prescriptions on File Prior to Visit  Medication Sig Dispense Refill  . aspirin EC 81 MG tablet Take 1 tablet (81 mg total) by mouth daily. 90 tablet 11  . atorvastatin (LIPITOR) 10 MG tablet Take 1 tablet (10 mg total) by mouth daily. 90 tablet 3  . cetirizine (ZYRTEC) 10 MG tablet Take 1 tablet (10 mg total) by mouth daily. 30 tablet 11  . meclizine (ANTIVERT) 12.5 MG tablet Take 1 tablet (12.5 mg total) by mouth 3 (three) times daily as needed for dizziness. 30 tablet 1  . triamcinolone (NASACORT AQ) 55 MCG/ACT AERO nasal inhaler Place 2 sprays into the nose daily. 1 Inhaler 12  . triamcinolone cream (KENALOG) 0.1 % Apply 1 application topically 2 (two) times daily. 30 g 0   No current facility-administered medications on file prior to visit.    Review of Systems Constitutional: Negative for other unusual diaphoresis, sweats, appetite or weight changes HENT: Negative for other worsening hearing loss, ear pain, facial swelling, mouth sores or neck stiffness.   Eyes: Negative for other worsening pain, redness or other visual disturbance.  Respiratory: Negative for other stridor or swelling Cardiovascular: Negative for other palpitations or other chest pain  Gastrointestinal: Negative for worsening diarrhea or loose stools, blood in stool, distention or other pain Genitourinary: Negative for hematuria, flank pain or other change in urine  volume.  Musculoskeletal: Negative for myalgias or other joint swelling.  Skin: Negative for other color change, or other wound or worsening drainage.  Neurological: Negative for other syncope or numbness. Hematological: Negative for other adenopathy or swelling Psychiatric/Behavioral: Negative for hallucinations, other worsening agitation, SI, self-injury, or new decreased concentration All other system neg per pt    Objective:   Physical Exam BP 130/86   Pulse 67   Temp 98.6 F (37 C) (Oral)   Ht 5\' 6"  (1.676 m)    Wt 155 lb (70.3 kg)   SpO2 99%   BMI 25.02 kg/m  VS noted,  Constitutional: Pt is oriented to person, place, and time. Appears well-developed and well-nourished, in no significant distress and comfortable Head: Normocephalic and atraumatic  Eyes: Conjunctivae and EOM are normal. Pupils are equal, round, and reactive to light Right Ear: External ear normal without discharge Left Ear: External ear normal without discharge Nose: Nose without discharge or deformity Mouth/Throat: Oropharynx is without other ulcerations and moist  Neck: Normal range of motion. Neck supple. No JVD present. No tracheal deviation present or significant neck LA or mass Cardiovascular: Normal rate, regular rhythm, normal heart sounds and intact distal pulses.   Pulmonary/Chest: WOB normal and breath sounds without rales or wheezing  Abdominal: Soft. Bowel sounds are normal. NT. No HSM  Musculoskeletal: Normal range of motion. Exhibits no edema Lymphadenopathy: Has no other cervical adenopathy.  Neurological: Pt is alert and oriented to person, place, and time. Pt has normal reflexes. No cranial nerve deficit. Motor grossly intact, Gait intact Skin: Skin is warm and dry. No rash noted or new ulcerations Psychiatric:  Has normal mood and affect. Behavior is normal without agitation No other exam findings     Assessment & Plan:

## 2016-09-23 NOTE — Assessment & Plan Note (Signed)
Intolerant lipitor, for lower chol diet, for ldl with labs today

## 2016-09-23 NOTE — Assessment & Plan Note (Signed)
For f/u PSA, consider urology f/u

## 2016-09-23 NOTE — Assessment & Plan Note (Signed)
Lab Results  Component Value Date   HGBA1C 5.9 09/21/2016

## 2016-09-23 NOTE — Assessment & Plan Note (Signed)

## 2016-10-15 DIAGNOSIS — N202 Calculus of kidney with calculus of ureter: Secondary | ICD-10-CM | POA: Diagnosis not present

## 2016-10-15 DIAGNOSIS — R1084 Generalized abdominal pain: Secondary | ICD-10-CM | POA: Diagnosis not present

## 2016-10-15 DIAGNOSIS — N200 Calculus of kidney: Secondary | ICD-10-CM | POA: Diagnosis not present

## 2016-10-15 DIAGNOSIS — R3129 Other microscopic hematuria: Secondary | ICD-10-CM | POA: Diagnosis not present

## 2016-10-15 DIAGNOSIS — R1111 Vomiting without nausea: Secondary | ICD-10-CM | POA: Diagnosis not present

## 2016-10-23 DIAGNOSIS — N202 Calculus of kidney with calculus of ureter: Secondary | ICD-10-CM | POA: Diagnosis not present

## 2016-10-25 ENCOUNTER — Encounter: Payer: Medicare Other | Admitting: Internal Medicine

## 2016-10-25 ENCOUNTER — Other Ambulatory Visit: Payer: Self-pay | Admitting: Urology

## 2016-10-25 ENCOUNTER — Encounter (HOSPITAL_COMMUNITY): Payer: Self-pay | Admitting: General Practice

## 2016-10-26 ENCOUNTER — Encounter (HOSPITAL_COMMUNITY): Payer: Self-pay | Admitting: General Practice

## 2016-10-29 ENCOUNTER — Encounter (HOSPITAL_COMMUNITY)
Admission: RE | Disposition: A | Discharge status: Home / Self Care | Payer: Self-pay | Source: Ambulatory Visit | Attending: Urology

## 2016-10-29 ENCOUNTER — Ambulatory Visit (HOSPITAL_COMMUNITY)
Admission: RE | Admit: 2016-10-29 | Discharge: 2016-10-29 | Disposition: A | Discharge status: Home / Self Care | Payer: Medicare Other | Source: Ambulatory Visit | Attending: Urology | Admitting: Urology

## 2016-10-29 ENCOUNTER — Ambulatory Visit (HOSPITAL_COMMUNITY): Payer: Medicare Other

## 2016-10-29 ENCOUNTER — Encounter (HOSPITAL_COMMUNITY): Payer: Self-pay

## 2016-10-29 DIAGNOSIS — N200 Calculus of kidney: Secondary | ICD-10-CM | POA: Diagnosis not present

## 2016-10-29 DIAGNOSIS — N210 Calculus in bladder: Secondary | ICD-10-CM | POA: Insufficient documentation

## 2016-10-29 DIAGNOSIS — Z87442 Personal history of urinary calculi: Secondary | ICD-10-CM | POA: Diagnosis not present

## 2016-10-29 DIAGNOSIS — N201 Calculus of ureter: Secondary | ICD-10-CM

## 2016-10-29 HISTORY — DX: Personal history of urinary calculi: Z87.442

## 2016-10-29 HISTORY — PX: EXTRACORPOREAL SHOCK WAVE LITHOTRIPSY: SHX1557

## 2016-10-29 SURGERY — LITHOTRIPSY, ESWL
Anesthesia: LOCAL | Laterality: Left

## 2016-10-29 MED ORDER — CIPROFLOXACIN HCL 500 MG PO TABS
500.0000 mg | ORAL_TABLET | ORAL | Status: AC
  Administered 2016-10-29: 500 mg via ORAL
  Filled 2016-10-29: qty 1

## 2016-10-29 MED ORDER — SODIUM CHLORIDE 0.9 % IV SOLN
INTRAVENOUS | Status: DC
  Administered 2016-10-29: 07:00:00 via INTRAVENOUS

## 2016-10-29 MED ORDER — DIPHENHYDRAMINE HCL 25 MG PO CAPS
25.0000 mg | ORAL_CAPSULE | ORAL | Status: AC
  Administered 2016-10-29: 25 mg via ORAL
  Filled 2016-10-29: qty 1

## 2016-10-29 MED ORDER — DIAZEPAM 5 MG PO TABS
10.0000 mg | ORAL_TABLET | ORAL | Status: AC
  Administered 2016-10-29: 10 mg via ORAL
  Filled 2016-10-29: qty 2

## 2016-10-29 NOTE — Op Note (Signed)
See [facility] Op note for full details.  Patient with left renal stone x2. Treated both stones on left with ESWL with good fragmentation. Patient still has untreated right renal stones.

## 2016-10-29 NOTE — H&P (Signed)
CC: I have ureteral stone.  HPI: [name] [name] is a 71 year-old male established patient who is here for ureteral stone.  10/15/16: Patient with prior history of renal stones. He presents today with complaints of left flank pain that began early this morning. The pain radiates to his left lower quadrant and groin. He denies any gross hematuria, fever, or dysuria. He is having some intermittent nausea. Currently, he states that he is feeling somewhat better and his pain has subsided.   10/23/16: Patient returns today for follow up. C.T imaging on 9/10 showed approximately 7 mm X 9 mm left proximal ureteral stone with moderate obstruction. It is 1100 HU. Skin to stone distance is approximately 10 cm. Multiple other renal stones were noted, as well as dependent bladder stone. He states that he feels he did see two small fragments pass, but was not using a strainer at the time. He currently denies any flank pain, dysuria, gross hematuria, nausea, or vomiting. He does have some increased frequency. He is not on any blood thinners. He denies cardiac or pulmonary history. He is not diabetic.   The problem is on the left side. He first stated noticing pain on 10/15/2016. This is not his first kidney stone. He is not currently having flank pain, back pain, groin pain, nausea, vomiting, fever or chills. Pain is occuring on the left side. He has not caught a stone in his urine strainer since his symptoms began.     ALLERGIES: No Allergies    MEDICATIONS: Tamsulosin Hcl 0.4 mg capsule, ext release 24 hr 1 capsule PO Daily  Hydrocodone-Acetaminophen 5 mg-325 mg tablet 1 tablet PO Q 6 H PRN     GU PSH: Vasectomy - 2009      PSH Notes: Surgery Of Male Genitalia Vasectomy   NON-GU PSH: No Non-GU PSH        GU PMH: Flank Pain - 10/15/2016 Elevated PSA, Elevated prostate specific antigen (PSA) - 06/17/2015 Renal calculus, Bilateral kidney stones - 06/17/2015 Prostate nodule w/o LUTS, Prostate nodule -  12/16/2014 Ureteral calculus, Calculus of right ureter - 12/16/2014, Calculus of left ureter, - 2014 Hydronephrosis Unspec, Hydronephrosis, right - 12/02/2014 Other microscopic hematuria, Microscopic hematuria - 12/02/2014 Urinary Tract Inf, Unspec site, Suspected urinary tract infection - 12/02/2014    NON-GU PMH: Bitten or stung by nonvenomous insect and other nonvenomous arthropods, initial encounter, Tick bites - 06/29/2015 Encounter for general adult medical examination without abnormal findings, Encounter for preventive health examination - 06/29/2015 Nausea, Nausea - 12/02/2014    FAMILY HISTORY: Acute Myocardial Infarction - Father Breast Cancer - Mother Diabetes - Father nephrolithiasis - Runs In Family   SOCIAL HISTORY: Marital Status: Married Preferred Language: English; Ethnicity: Not Hispanic Or Latino; Race: White Current Smoking Status: Patient has never smoked.  <DIV'  Tobacco Use Assessment Completed:  Used Tobacco in last 30 days?   Does drink.  Drinks 1 caffeinated drink per day.     Notes: Never A Smoker, Marital History - Currently Married, Occupation:, Alcohol Use, Tobacco Use   REVIEW OF SYSTEMS:     GU Review Male:  Patient reports frequent urination and get up at night to urinate. Patient denies hard to postpone urination, burning/ pain with urination, leakage of urine, stream starts and stops, trouble starting your stream, have to strain to urinate , erection problems, and penile pain.    Gastrointestinal (Upper):  Patient denies nausea, vomiting, and indigestion/ heartburn.    Gastrointestinal (Lower):  Patient denies diarrhea and  constipation.    Constitutional:  Patient denies weight loss, fatigue, night sweats, and fever.    Skin:  Patient denies skin rash/ lesion and itching.    Eyes:  Patient denies blurred vision and double vision.    Ears/ Nose/ Throat:  Patient denies sore throat and sinus problems.    Hematologic/Lymphatic:  Patient denies swollen  glands and easy bruising.    Cardiovascular:  Patient denies leg swelling and chest pains.    Respiratory:  Patient denies cough and shortness of breath.    Endocrine:  Patient denies excessive thirst.    Musculoskeletal:  Patient denies back pain and joint pain.    Neurological:  Patient denies headaches and dizziness.    Psychologic:  Patient denies depression and anxiety.    VITAL SIGNS:       10/23/2016 03:32 PM     Weight 155 lb / 70.31 kg     Height 66 in / 167.64 cm     BP 158/84 mmHg     Pulse 88 /min     Temperature 98.3 F / 36.8 C     BMI 25.0 kg/m     MULTI-SYSTEM PHYSICAL EXAMINATION:      Constitutional: Well-nourished. No physical deformities. Normally developed. Good grooming.     Respiratory: Normal breath sounds. No labored breathing, no use of accessory muscles.      Cardiovascular: Regular rate and rhythm. No murmur, no gallop. Normal temperature, swelling, no varicosities.      Skin: No paleness, no jaundice, no cyanosis. No lesion, no ulcer, no rash.     Neurologic / Psychiatric: Oriented to time, oriented to place, oriented to person. No depression, no anxiety, no agitation.     Gastrointestinal: No mass, no tenderness, no rigidity, non obese abdomen.     Musculoskeletal: Spine, ribs, pelvis no bilateral tenderness. Normal gait and station of head and neck.            PAST DATA REVIEWED:   Source Of History:  Patient  Records Review:  Previous Patient Records  Urine Test Review:  Urinalysis, Urine Culture  X-Ray Review: C.T. Stone Protocol: Reviewed Films.      06/23/15 12/18/14 09/09/14 06/03/14 11/07/13 04/27/13 05/30/10 11/29/09  PSA  Total PSA 13.40  14.33  13.65  17.46  12.84  13.93  10.95  9.19   Free PSA        1.2   % Free PSA        13     PROCEDURES:    KUB - [id number]  A single view of the abdomen is obtained. Opacities noted within the confines of bilateral renal shadows which appear stable when compared with C.T imaging. Opacity still noted  in the vicinity of the left proximal ureter, measuring approximately 7 mm X 6 mm. Significant bowel gas overlying bladder. Stable pelvic calcifications.           Urinalysis w/Scope  Dipstick Dipstick Cont'd Micro  Color: Yellow Bilirubin: Neg WBC/hpf: 0 - 5/hpf  Appearance: Clear Ketones: Neg RBC/hpf: 3 - 10/hpf  Specific Gravity: 1.025 Blood: 1+ Bacteria: NS (Not Seen)  pH: 6.0 Protein: Neg Cystals: NS (Not Seen)  Glucose: Neg Urobilinogen: 0.2 Casts: NS (Not Seen)   Nitrites: Neg Trichomonas: Not Present   Leukocyte Esterase: Neg Mucous: Present    Epithelial Cells: NS (Not Seen)    Yeast: NS (Not Seen)    Sperm: Not Present    ASSESSMENT:     ICD-10 Details  1 GU:  Renal calculus - N20.0   2  Ureteral calculus - N20.1    PLAN:   Orders  Labs Urine Culture  X-Rays: KUB  Schedule    Document  Letter(s):  Created for Patient: Clinical Summary   1. Bilateral renal stones 2. 5 mm bladder stone -Left ureteral stone migrated back into left kidney. Will treat left renal stone. Will need follow up of right renal stone cluster and 5 mm bladder stone

## 2016-11-05 ENCOUNTER — Encounter (HOSPITAL_COMMUNITY): Payer: Self-pay | Admitting: Urology

## 2016-11-05 DIAGNOSIS — N200 Calculus of kidney: Secondary | ICD-10-CM | POA: Diagnosis not present

## 2016-11-12 DIAGNOSIS — N200 Calculus of kidney: Secondary | ICD-10-CM | POA: Diagnosis not present

## 2016-11-15 ENCOUNTER — Other Ambulatory Visit: Payer: Self-pay | Admitting: Urology

## 2016-11-16 ENCOUNTER — Other Ambulatory Visit: Payer: Self-pay | Admitting: Urology

## 2016-11-20 DIAGNOSIS — N200 Calculus of kidney: Secondary | ICD-10-CM | POA: Diagnosis not present

## 2016-11-22 ENCOUNTER — Encounter (HOSPITAL_COMMUNITY): Admission: RE | Payer: Self-pay | Source: Ambulatory Visit

## 2016-11-22 ENCOUNTER — Ambulatory Visit (HOSPITAL_COMMUNITY): Admission: RE | Admit: 2016-11-22 | Payer: Medicare Other | Source: Ambulatory Visit | Admitting: Urology

## 2016-11-22 SURGERY — CYSTOSCOPY/URETEROSCOPY/HOLMIUM LASER/STENT PLACEMENT
Anesthesia: General | Laterality: Left

## 2016-11-29 ENCOUNTER — Ambulatory Visit (INDEPENDENT_AMBULATORY_CARE_PROVIDER_SITE_OTHER): Payer: Medicare Other | Admitting: General Practice

## 2016-11-29 DIAGNOSIS — Z23 Encounter for immunization: Secondary | ICD-10-CM

## 2017-06-11 DIAGNOSIS — R972 Elevated prostate specific antigen [PSA]: Secondary | ICD-10-CM | POA: Diagnosis not present

## 2017-06-18 DIAGNOSIS — N200 Calculus of kidney: Secondary | ICD-10-CM | POA: Diagnosis not present

## 2017-06-18 DIAGNOSIS — R972 Elevated prostate specific antigen [PSA]: Secondary | ICD-10-CM | POA: Diagnosis not present

## 2017-10-03 ENCOUNTER — Encounter: Payer: Self-pay | Admitting: Internal Medicine

## 2017-10-03 ENCOUNTER — Ambulatory Visit (INDEPENDENT_AMBULATORY_CARE_PROVIDER_SITE_OTHER): Payer: Medicare Other | Admitting: Internal Medicine

## 2017-10-03 ENCOUNTER — Other Ambulatory Visit: Payer: Self-pay | Admitting: Internal Medicine

## 2017-10-03 ENCOUNTER — Other Ambulatory Visit (INDEPENDENT_AMBULATORY_CARE_PROVIDER_SITE_OTHER): Payer: Medicare Other

## 2017-10-03 VITALS — BP 118/78 | HR 75 | Temp 98.6°F | Ht 66.0 in | Wt 156.0 lb

## 2017-10-03 DIAGNOSIS — Z23 Encounter for immunization: Secondary | ICD-10-CM | POA: Diagnosis not present

## 2017-10-03 DIAGNOSIS — S80861A Insect bite (nonvenomous), right lower leg, initial encounter: Secondary | ICD-10-CM | POA: Diagnosis not present

## 2017-10-03 DIAGNOSIS — Z0001 Encounter for general adult medical examination with abnormal findings: Secondary | ICD-10-CM | POA: Diagnosis not present

## 2017-10-03 DIAGNOSIS — W57XXXA Bitten or stung by nonvenomous insect and other nonvenomous arthropods, initial encounter: Secondary | ICD-10-CM | POA: Diagnosis not present

## 2017-10-03 DIAGNOSIS — R21 Rash and other nonspecific skin eruption: Secondary | ICD-10-CM | POA: Diagnosis not present

## 2017-10-03 DIAGNOSIS — R739 Hyperglycemia, unspecified: Secondary | ICD-10-CM | POA: Diagnosis not present

## 2017-10-03 DIAGNOSIS — Z0000 Encounter for general adult medical examination without abnormal findings: Secondary | ICD-10-CM | POA: Diagnosis not present

## 2017-10-03 LAB — BASIC METABOLIC PANEL
BUN: 14 mg/dL (ref 6–23)
CALCIUM: 8.9 mg/dL (ref 8.4–10.5)
CO2: 27 mEq/L (ref 19–32)
CREATININE: 1 mg/dL (ref 0.40–1.50)
Chloride: 104 mEq/L (ref 96–112)
GFR: 78.32 mL/min (ref >60.00)
Glucose, Bld: 106 mg/dL — ABNORMAL HIGH (ref 70–99)
Potassium: 3.8 mEq/L (ref 3.5–5.1)
Sodium: 138 mEq/L (ref 135–145)

## 2017-10-03 LAB — CBC WITH DIFFERENTIAL/PLATELET
BASOS ABS: 0.1 10*3/uL (ref 0.0–0.1)
Basophils Relative: 1.2 % (ref 0.0–3.0)
EOS PCT: 5.4 % — AB (ref 0.0–5.0)
Eosinophils Absolute: 0.4 10*3/uL (ref 0.0–0.7)
HCT: 44 % (ref 39.0–52.0)
Hemoglobin: 15 g/dL (ref 13.0–17.0)
Lymphocytes Relative: 26.7 % (ref 12.0–46.0)
Lymphs Abs: 1.7 10*3/uL (ref 0.7–4.0)
MCHC: 34.2 g/dL (ref 30.0–36.0)
MCV: 94.3 fl (ref 78.0–100.0)
Monocytes Absolute: 0.7 10*3/uL (ref 0.1–1.0)
Monocytes Relative: 10 % (ref 3.0–12.0)
NEUTROS ABS: 3.7 10*3/uL (ref 1.4–7.7)
Neutrophils Relative %: 56.7 % (ref 43.0–77.0)
Platelets: 257 10*3/uL (ref 150.0–400.0)
RBC: 4.67 Mil/uL (ref 4.22–5.81)
RDW: 12.9 % (ref 11.5–15.5)
WBC: 6.5 10*3/uL (ref 4.0–10.5)

## 2017-10-03 LAB — HEPATIC FUNCTION PANEL
ALBUMIN: 4.3 g/dL (ref 3.5–5.2)
ALK PHOS: 71 U/L (ref 39–117)
ALT: 10 U/L (ref 0–53)
AST: 13 U/L (ref 0–37)
Bilirubin, Direct: 0.1 mg/dL (ref 0.0–0.3)
TOTAL PROTEIN: 7.2 g/dL (ref 6.0–8.3)
Total Bilirubin: 0.4 mg/dL (ref 0.2–1.2)

## 2017-10-03 LAB — LIPID PANEL
CHOLESTEROL: 210 mg/dL — AB (ref 0–200)
HDL: 35.7 mg/dL — ABNORMAL LOW (ref >39.00)
LDL Cholesterol: 143 mg/dL — ABNORMAL HIGH (ref 0–99)
NonHDL: 174.04
TRIGLYCERIDES: 157 mg/dL — AB (ref 0.0–149.0)
Total CHOL/HDL Ratio: 6
VLDL: 31.4 mg/dL (ref 0.0–40.0)

## 2017-10-03 LAB — HEMOGLOBIN A1C: Hgb A1c MFr Bld: 6.1 % (ref 4.6–6.5)

## 2017-10-03 LAB — TSH: TSH: 1.73 u[IU]/mL (ref 0.35–4.50)

## 2017-10-03 MED ORDER — METHYLPREDNISOLONE ACETATE 80 MG/ML IJ SUSP
80.0000 mg | Freq: Once | INTRAMUSCULAR | Status: AC
  Administered 2017-10-03: 80 mg via INTRAMUSCULAR

## 2017-10-03 MED ORDER — ROSUVASTATIN CALCIUM 10 MG PO TABS: 10.0000 mg | ORAL_TABLET | Freq: Every day | ORAL | 3 refills | Status: DC

## 2017-10-03 MED ORDER — DOXYCYCLINE HYCLATE 100 MG PO TABS: 100.0000 mg | ORAL_TABLET | Freq: Two times a day (BID) | ORAL | 0 refills | Status: DC

## 2017-10-03 MED ORDER — CLOBETASOL PROPIONATE 0.05 % EX CREA: 1.0000 "application " | TOPICAL_CREAM | Freq: Two times a day (BID) | CUTANEOUS | 1 refills | Status: DC

## 2017-10-03 NOTE — Patient Instructions (Addendum)
You had the Pneumovax pneumonia shot today  You had the steroid shot today  Please take all new medication as prescribed - the doxycycline antibiotic, and the topical steroid cream if needed  Please continue all other medications as before, and refills have been done if requested.  Please have the pharmacy call with any other refills you may need.  Please continue your efforts at being more active, low cholesterol diet, and weight control.  You are otherwise up to date with prevention measures today.  Please keep your appointments with your specialists as you may have planned  Please go to the LAB in the Basement (turn left off the elevator) for the tests to be done today  You will be contacted by phone if any changes need to be made immediately.  Otherwise, you will receive a letter about your results with an explanation, but please check with MyChart first.  Please remember to sign up for MyChart if you have not done so, as this will be important to you in the future with finding out test results, communicating by private email, and scheduling acute appointments online when needed.  Please return in 6 months, or sooner if needed

## 2017-10-03 NOTE — Progress Notes (Signed)
Subjective:    Patient ID: [name] [name], male    DOB: 1945/09/29, 72 y.o.   MRN: [id number]  HPI   Here for wellness and f/u;  Overall doing ok;  Pt denies Chest pain, worsening SOB, DOE, wheezing, orthopnea, PND, worsening LE edema, palpitations, dizziness or syncope.  Pt denies neurological change such as new headache, facial or extremity weakness.  Pt denies polydipsia, polyuria, or low sugar symptoms. Pt states overall good compliance with treatment and medications, good tolerability, and has been trying to follow appropriate diet.  Pt denies worsening depressive symptoms, suicidal ideation or panic. [name] fever, night sweats, wt loss, loss of appetite, or other constitutional symptoms.  Pt states good ability with ADL's, has low fall risk, home safety reviewed and adequate, [name] other significant changes in hearing or vision, and only occasionally active with exercise. Had PSA and ua with Dr Borden/urology in May 2019, does not need today.  Does have a rash scaly and itchy to distal RLE without fever or pain, ongoing for several months, just gets bigger with scratching, seemed to start overall after tick bite now with the spreading rash and mild discomfort Past Medical History:  Diagnosis Date  . Diabetes mellitus   . GERD (gastroesophageal reflux disease)   . History of kidney stones   . Hypertension   . Nephrolithiasis   . Osteoarthritis   . Umbilical hernia    Past Surgical History:  Procedure Laterality Date  . EXTRACORPOREAL SHOCK WAVE LITHOTRIPSY Left 10/29/2016   Procedure: LEFT EXTRACORPOREAL SHOCK WAVE LITHOTRIPSY (ESWL);  Surgeon: Hildred Laser, MD;  Location: [facility];  Service: Urology;  Laterality: Left;  . HERNIA REPAIR     LIH  . HERNIA REPAIR  12/06/10   umbilical hernia   . VASECTOMY      reports that he has never smoked. He has never used smokeless tobacco. He reports that he does not drink alcohol or use drugs. family history is not on file. Allergies  Allergen  Reactions  . Lipitor [Atorvastatin] Other (See Comments)    "feels funny inside"   Current Outpatient Medications on File Prior to Visit  Medication Sig Dispense Refill  . ondansetron (ZOFRAN) 4 MG tablet Take 4 mg by mouth every 8 (eight) hours as needed for nausea/vomiting.    Marland Kitchen oxyCODONE-acetaminophen (PERCOCET/ROXICET) 5-325 MG tablet Take 1 tablet by mouth 3 (three) times daily as needed for pain.    . tamsulosin (FLOMAX) 0.4 MG CAPS capsule Take 0.4 mg by mouth daily.      [name] current facility-administered medications on file prior to visit.    Review of Systems Constitutional: Negative for other unusual diaphoresis, sweats, appetite or weight changes HENT: Negative for other worsening hearing loss, ear pain, facial swelling, mouth sores or neck stiffness.   Eyes: Negative for other worsening pain, redness or other visual disturbance.  Respiratory: Negative for other stridor or swelling Cardiovascular: Negative for other palpitations or other chest pain  Gastrointestinal: Negative for worsening diarrhea or loose stools, blood in stool, distention or other pain Genitourinary: Negative for hematuria, flank pain or other change in urine volume.  Musculoskeletal: Negative for myalgias or other joint swelling.  Skin: Negative for other color change, or other wound or worsening drainage.  Neurological: Negative for other syncope or numbness. Hematological: Negative for other adenopathy or swelling Psychiatric/Behavioral: Negative for hallucinations, other worsening agitation, SI, self-injury, or new decreased concentration All other system neg per pt    Objective:   Physical  Exam BP 118/78   Pulse 75   Temp 98.6 F (37 C) (Oral)   Ht 5\' 6"  (1.676 m)   Wt 156 lb (70.8 kg)   SpO2 96%   BMI 25.18 kg/m  VS noted,  Constitutional: Pt is oriented to person, place, and time. Appears well-developed and well-nourished, in [name] significant distress and comfortable Head: Normocephalic and  atraumatic  Eyes: Conjunctivae and EOM are normal. Pupils are equal, round, and reactive to light Right Ear: External ear normal without discharge Left Ear: External ear normal without discharge Nose: Nose without discharge or deformity Mouth/Throat: Oropharynx is without other ulcerations and moist  Neck: Normal range of motion. Neck supple. [name] JVD present. [name] tracheal deviation present or significant neck LA or mass Cardiovascular: Normal rate, regular rhythm, normal heart sounds and intact distal pulses Pulmonary/Chest: WOB normal and breath sounds without rales or wheezing  Abdominal: Soft. Bowel sounds are normal. NT. [name] HSM  Musculoskeletal: Normal range of motion. Exhibits [name] edema Lymphadenopathy: Has [name] other cervical adenopathy.  Neurological: Pt is alert and oriented to person, place, and time. Pt has normal reflexes. [name] cranial nerve deficit. Motor grossly intact, Gait intact Skin: Skin is warm and dry. + rash 6 x 3 cm area right distal lateral leg, scaly nontender, noted or new ulcerations Psychiatric:  Has normal mood and affect. Behavior is normal without agitation [name] other exam findings Lab Results  Component Value Date   WBC 6.5 10/03/2017   HGB 15.0 10/03/2017   HCT 44.0 10/03/2017   PLT 257.0 10/03/2017   GLUCOSE 106 (H) 10/03/2017   CHOL 210 (H) 10/03/2017   TRIG 157.0 (H) 10/03/2017   HDL 35.70 (L) 10/03/2017   LDLCALC 143 (H) 10/03/2017   ALT 10 10/03/2017   AST 13 10/03/2017   NA 138 10/03/2017   K 3.8 10/03/2017   CL 104 10/03/2017   CREATININE 1.00 10/03/2017   BUN 14 10/03/2017   CO2 27 10/03/2017   TSH 1.73 10/03/2017   PSA 14.20 (H) 09/21/2016   HGBA1C 6.1 10/03/2017       Assessment & Plan:

## 2017-10-04 ENCOUNTER — Telehealth: Payer: Self-pay

## 2017-10-04 NOTE — Telephone Encounter (Signed)
-----   Message from Corwin LevinsJames W John, MD sent at 10/03/2017  8:33 PM EDT ----- Left message on MyChart, pt to cont same tx except  The test results show that your current treatment is OK, except the LDL cholesterol is moderately high.  Please start a low dose medication called Crestor 10 mg, as this will reduce the risk of heart disease and stroke in the future.Lendell Caprice.    Ahyana Skillin to please inform pt, I will do rX

## 2017-10-04 NOTE — Telephone Encounter (Signed)
Called pt, LVM.   CRM created.  

## 2017-10-05 NOTE — Assessment & Plan Note (Signed)
For doxy course,  to f/u any worsening symptoms or concerns 

## 2017-10-05 NOTE — Assessment & Plan Note (Signed)

## 2017-10-05 NOTE — Assessment & Plan Note (Addendum)
C/w dermatitis, for depomedrol IM 80, clobetasol 0.05 % asd, for deet with further woods walking  In addition to the time spent performing CPE, I spent an additional 15 minutes face to face,in which greater than 50% of this time was spent in counseling and coordination of care for patient's illness as documented, including the differential dx, treatment, further evaluation and other management of rash, tick bite, and hyperglycemia

## 2017-10-05 NOTE — Assessment & Plan Note (Signed)
stable overall by history and exam, recent data reviewed with pt, and pt to continue medical treatment as before,  to f/u any worsening symptoms or concerns Lab Results  Component Value Date   HGBA1C 6.1 10/03/2017   

## 2017-10-15 ENCOUNTER — Ambulatory Visit: Payer: Medicare Other

## 2017-10-29 ENCOUNTER — Ambulatory Visit (INDEPENDENT_AMBULATORY_CARE_PROVIDER_SITE_OTHER): Payer: Medicare Other

## 2017-10-29 DIAGNOSIS — Z23 Encounter for immunization: Secondary | ICD-10-CM | POA: Diagnosis not present

## 2018-06-13 DIAGNOSIS — N200 Calculus of kidney: Secondary | ICD-10-CM | POA: Diagnosis not present

## 2018-07-22 DIAGNOSIS — K4090 Unilateral inguinal hernia, without obstruction or gangrene, not specified as recurrent: Secondary | ICD-10-CM | POA: Diagnosis not present

## 2018-08-04 ENCOUNTER — Other Ambulatory Visit: Payer: Self-pay | Admitting: General Surgery

## 2018-08-27 ENCOUNTER — Other Ambulatory Visit: Payer: Self-pay

## 2018-08-27 ENCOUNTER — Encounter (HOSPITAL_BASED_OUTPATIENT_CLINIC_OR_DEPARTMENT_OTHER): Payer: Self-pay | Admitting: *Deleted

## 2018-09-01 ENCOUNTER — Other Ambulatory Visit (HOSPITAL_COMMUNITY)
Admission: RE | Admit: 2018-09-01 | Discharge: 2018-09-01 | Disposition: A | Discharge status: Home / Self Care | Payer: Medicare Other | Source: Ambulatory Visit | Attending: General Surgery | Admitting: General Surgery

## 2018-09-01 DIAGNOSIS — Z20828 Contact with and (suspected) exposure to other viral communicable diseases: Secondary | ICD-10-CM | POA: Insufficient documentation

## 2018-09-01 LAB — SARS CORONAVIRUS 2 (TAT 6-24 HRS): SARS Coronavirus 2: NEGATIVE

## 2018-09-01 NOTE — Progress Notes (Signed)
Pt in to pick up pre surgical drink. Instructions reviewed.

## 2018-09-03 NOTE — Anesthesia Preprocedure Evaluation (Addendum)
Anesthesia Evaluation  Patient identified by MRN, date of birth, ID band Patient awake    Reviewed: Allergy & Precautions, NPO status , Patient's Chart, lab work & pertinent test results  History of Anesthesia Complications Negative for: history of anesthetic complications  Airway Mallampati: IV  TM Distance: <3 FB Neck ROM: Full  Mouth opening: Limited Mouth Opening  Dental no notable dental hx.    Pulmonary neg pulmonary ROS,    Pulmonary exam normal        Cardiovascular negative cardio ROS Normal cardiovascular exam     Neuro/Psych negative neurological ROS     GI/Hepatic Neg liver ROS, GERD  Controlled,Inguinal hernia   Endo/Other  negative endocrine ROS  Renal/GU negative Renal ROS     Musculoskeletal  (+) Arthritis ,   Abdominal   Peds  Hematology negative hematology ROS (+)   Anesthesia Other Findings Day of surgery medications reviewed with the patient.  Reproductive/Obstetrics                            Anesthesia Physical Anesthesia Plan  ASA: II  Anesthesia Plan: General   Post-op Pain Management: GA combined w/ Regional for post-op pain   Induction: Intravenous  PONV Risk Score and Plan: 2 and Treatment may vary due to age or medical condition, Ondansetron and Dexamethasone  Airway Management Planned: Oral ETT  Additional Equipment: None  Intra-op Plan:   Post-operative Plan: Extubation in OR  Informed Consent: I have reviewed the patients History and Physical, chart, labs and discussed the procedure including the risks, benefits and alternatives for the proposed anesthesia with the patient or authorized representative who has indicated his/her understanding and acceptance.     Dental advisory given  Plan Discussed with: CRNA  Anesthesia Plan Comments:        Anesthesia Quick Evaluation

## 2018-09-04 ENCOUNTER — Ambulatory Visit (HOSPITAL_BASED_OUTPATIENT_CLINIC_OR_DEPARTMENT_OTHER): Payer: Medicare Other | Admitting: Anesthesiology

## 2018-09-04 ENCOUNTER — Ambulatory Visit (HOSPITAL_BASED_OUTPATIENT_CLINIC_OR_DEPARTMENT_OTHER)
Admission: RE | Admit: 2018-09-04 | Discharge: 2018-09-04 | Disposition: A | Discharge status: Home / Self Care | Payer: Medicare Other | Attending: General Surgery | Admitting: General Surgery

## 2018-09-04 ENCOUNTER — Encounter (HOSPITAL_BASED_OUTPATIENT_CLINIC_OR_DEPARTMENT_OTHER): Payer: Self-pay | Admitting: Emergency Medicine

## 2018-09-04 ENCOUNTER — Other Ambulatory Visit: Payer: Self-pay

## 2018-09-04 ENCOUNTER — Encounter (HOSPITAL_BASED_OUTPATIENT_CLINIC_OR_DEPARTMENT_OTHER)
Admission: RE | Disposition: A | Discharge status: Home / Self Care | Payer: Self-pay | Source: Home / Self Care | Attending: General Surgery

## 2018-09-04 DIAGNOSIS — K4090 Unilateral inguinal hernia, without obstruction or gangrene, not specified as recurrent: Secondary | ICD-10-CM | POA: Diagnosis not present

## 2018-09-04 DIAGNOSIS — Z8249 Family history of ischemic heart disease and other diseases of the circulatory system: Secondary | ICD-10-CM | POA: Diagnosis not present

## 2018-09-04 DIAGNOSIS — N400 Enlarged prostate without lower urinary tract symptoms: Secondary | ICD-10-CM | POA: Diagnosis not present

## 2018-09-04 DIAGNOSIS — M1990 Unspecified osteoarthritis, unspecified site: Secondary | ICD-10-CM | POA: Diagnosis not present

## 2018-09-04 DIAGNOSIS — E875 Hyperkalemia: Secondary | ICD-10-CM | POA: Diagnosis not present

## 2018-09-04 DIAGNOSIS — Z79899 Other long term (current) drug therapy: Secondary | ICD-10-CM | POA: Insufficient documentation

## 2018-09-04 DIAGNOSIS — M797 Fibromyalgia: Secondary | ICD-10-CM | POA: Diagnosis not present

## 2018-09-04 DIAGNOSIS — K219 Gastro-esophageal reflux disease without esophagitis: Secondary | ICD-10-CM | POA: Insufficient documentation

## 2018-09-04 DIAGNOSIS — E785 Hyperlipidemia, unspecified: Secondary | ICD-10-CM | POA: Diagnosis not present

## 2018-09-04 DIAGNOSIS — G8918 Other acute postprocedural pain: Secondary | ICD-10-CM | POA: Diagnosis not present

## 2018-09-04 HISTORY — DX: Hyperlipidemia, unspecified: E78.5

## 2018-09-04 HISTORY — PX: INGUINAL HERNIA REPAIR: SHX194

## 2018-09-04 HISTORY — DX: Unilateral inguinal hernia, without obstruction or gangrene, not specified as recurrent: K40.90

## 2018-09-04 SURGERY — REPAIR, HERNIA, INGUINAL, ADULT
Anesthesia: General | Site: Groin | Laterality: Right

## 2018-09-04 MED ORDER — PROPOFOL 10 MG/ML IV BOLUS
INTRAVENOUS | Status: DC | PRN
  Administered 2018-09-04: 130 mg via INTRAVENOUS

## 2018-09-04 MED ORDER — MIDAZOLAM HCL 2 MG/2ML IJ SOLN
1.0000 mg | INTRAMUSCULAR | Status: DC | PRN
  Administered 2018-09-04: 1 mg via INTRAVENOUS

## 2018-09-04 MED ORDER — FENTANYL CITRATE (PF) 100 MCG/2ML IJ SOLN
INTRAMUSCULAR | Status: AC
  Filled 2018-09-04: qty 2

## 2018-09-04 MED ORDER — BUPIVACAINE-EPINEPHRINE (PF) 0.5% -1:200000 IJ SOLN
INTRAMUSCULAR | Status: DC | PRN
  Administered 2018-09-04: 30 mL via PERINEURAL

## 2018-09-04 MED ORDER — PHENYLEPHRINE HCL (PRESSORS) 10 MG/ML IV SOLN
INTRAVENOUS | Status: DC | PRN
  Administered 2018-09-04: 80 ug via INTRAVENOUS

## 2018-09-04 MED ORDER — SUGAMMADEX SODIUM 200 MG/2ML IV SOLN
INTRAVENOUS | Status: DC | PRN
  Administered 2018-09-04: 200 mg via INTRAVENOUS

## 2018-09-04 MED ORDER — OXYCODONE HCL 5 MG PO TABS: 5.0000 mg | ORAL_TABLET | Freq: Once | ORAL | Status: DC | PRN

## 2018-09-04 MED ORDER — PROMETHAZINE HCL 25 MG/ML IJ SOLN: 6.2500 mg | INTRAMUSCULAR | Status: DC | PRN

## 2018-09-04 MED ORDER — GABAPENTIN 100 MG PO CAPS
100.0000 mg | ORAL_CAPSULE | ORAL | Status: AC
  Administered 2018-09-04: 100 mg via ORAL

## 2018-09-04 MED ORDER — ACETAMINOPHEN 10 MG/ML IV SOLN: 1000.0000 mg | Freq: Once | INTRAVENOUS | Status: DC | PRN

## 2018-09-04 MED ORDER — ONDANSETRON HCL 4 MG/2ML IJ SOLN
INTRAMUSCULAR | Status: AC
  Filled 2018-09-04: qty 2

## 2018-09-04 MED ORDER — OXYCODONE HCL 5 MG PO TABS: 5.0000 mg | ORAL_TABLET | Freq: Four times a day (QID) | ORAL | 0 refills | Status: DC | PRN

## 2018-09-04 MED ORDER — GABAPENTIN 100 MG PO CAPS
ORAL_CAPSULE | ORAL | Status: AC
  Filled 2018-09-04: qty 1

## 2018-09-04 MED ORDER — DEXAMETHASONE SODIUM PHOSPHATE 4 MG/ML IJ SOLN
INTRAMUSCULAR | Status: DC | PRN
  Administered 2018-09-04: 4 mg via INTRAVENOUS

## 2018-09-04 MED ORDER — ENSURE PRE-SURGERY PO LIQD: 296.0000 mL | Freq: Once | ORAL | Status: DC

## 2018-09-04 MED ORDER — LACTATED RINGERS IV SOLN
INTRAVENOUS | Status: DC
  Administered 2018-09-04: 08:00:00 via INTRAVENOUS
  Administered 2018-09-04: 10 mL/h via INTRAVENOUS

## 2018-09-04 MED ORDER — DEXAMETHASONE SODIUM PHOSPHATE 10 MG/ML IJ SOLN
INTRAMUSCULAR | Status: AC
  Filled 2018-09-04: qty 1

## 2018-09-04 MED ORDER — CEFAZOLIN SODIUM-DEXTROSE 2-4 GM/100ML-% IV SOLN
INTRAVENOUS | Status: AC
  Filled 2018-09-04: qty 100

## 2018-09-04 MED ORDER — MIDAZOLAM HCL 2 MG/2ML IJ SOLN
INTRAMUSCULAR | Status: AC
  Filled 2018-09-04: qty 2

## 2018-09-04 MED ORDER — BUPIVACAINE HCL (PF) 0.25 % IJ SOLN
INTRAMUSCULAR | Status: DC | PRN
  Administered 2018-09-04: 7 mL

## 2018-09-04 MED ORDER — ACETAMINOPHEN 500 MG PO TABS
ORAL_TABLET | ORAL | Status: AC
  Filled 2018-09-04: qty 2

## 2018-09-04 MED ORDER — ROCURONIUM BROMIDE 100 MG/10ML IV SOLN
INTRAVENOUS | Status: DC | PRN
  Administered 2018-09-04: 50 mg via INTRAVENOUS

## 2018-09-04 MED ORDER — LIDOCAINE HCL (CARDIAC) PF 100 MG/5ML IV SOSY
PREFILLED_SYRINGE | INTRAVENOUS | Status: DC | PRN
  Administered 2018-09-04: 60 mg via INTRAVENOUS

## 2018-09-04 MED ORDER — OXYCODONE HCL 5 MG/5ML PO SOLN: 5.0000 mg | Freq: Once | ORAL | Status: DC | PRN

## 2018-09-04 MED ORDER — EPHEDRINE SULFATE 50 MG/ML IJ SOLN
INTRAMUSCULAR | Status: DC | PRN
  Administered 2018-09-04: 10 mg via INTRAVENOUS

## 2018-09-04 MED ORDER — FENTANYL CITRATE (PF) 100 MCG/2ML IJ SOLN: 25.0000 ug | INTRAMUSCULAR | Status: DC | PRN

## 2018-09-04 MED ORDER — FENTANYL CITRATE (PF) 100 MCG/2ML IJ SOLN
50.0000 ug | INTRAMUSCULAR | Status: AC | PRN
  Administered 2018-09-04 (×3): 50 ug via INTRAVENOUS

## 2018-09-04 MED ORDER — CEFAZOLIN SODIUM-DEXTROSE 2-4 GM/100ML-% IV SOLN
2.0000 g | INTRAVENOUS | Status: AC
  Administered 2018-09-04: 09:00:00 2 g via INTRAVENOUS

## 2018-09-04 MED ORDER — LIDOCAINE 2% (20 MG/ML) 5 ML SYRINGE
INTRAMUSCULAR | Status: AC
  Filled 2018-09-04: qty 5

## 2018-09-04 MED ORDER — SCOPOLAMINE 1 MG/3DAYS TD PT72: 1.0000 | MEDICATED_PATCH | Freq: Once | TRANSDERMAL | Status: DC

## 2018-09-04 MED ORDER — ACETAMINOPHEN 500 MG PO TABS
1000.0000 mg | ORAL_TABLET | ORAL | Status: AC
  Administered 2018-09-04: 1000 mg via ORAL

## 2018-09-04 SURGICAL SUPPLY — 45 items
ADH SKN CLS APL DERMABOND .7 (GAUZE/BANDAGES/DRESSINGS) ×1
APL PRP STRL LF DISP 70% ISPRP (MISCELLANEOUS) ×1
BLADE CLIPPER SURG (BLADE) ×2 IMPLANT
BLADE SURG 15 STRL LF DISP TIS (BLADE) ×1 IMPLANT
BLADE SURG 15 STRL SS (BLADE) ×3
CHLORAPREP W/TINT 26 (MISCELLANEOUS) ×3 IMPLANT
CLOSURE WOUND 1/2 X4 (GAUZE/BANDAGES/DRESSINGS) ×1
COVER BACK TABLE REUSABLE LG (DRAPES) ×3 IMPLANT
COVER MAYO STAND REUSABLE (DRAPES) ×3 IMPLANT
COVER WAND RF STERILE (DRAPES) IMPLANT
DECANTER SPIKE VIAL GLASS SM (MISCELLANEOUS) IMPLANT
DERMABOND ADVANCED (GAUZE/BANDAGES/DRESSINGS) ×2
DERMABOND ADVANCED .7 DNX12 (GAUZE/BANDAGES/DRESSINGS) ×1 IMPLANT
DRAIN PENROSE 1/2X12 LTX STRL (WOUND CARE) ×3 IMPLANT
DRAPE LAPAROTOMY TRNSV 102X78 (DRAPES) ×3 IMPLANT
DRAPE UTILITY XL STRL (DRAPES) ×3 IMPLANT
ELECT COATED BLADE 2.86 ST (ELECTRODE) ×3 IMPLANT
ELECT REM PT RETURN 9FT ADLT (ELECTROSURGICAL) ×3
ELECTRODE REM PT RTRN 9FT ADLT (ELECTROSURGICAL) ×1 IMPLANT
GLOVE BIO SURGEON STRL SZ7 (GLOVE) ×3 IMPLANT
GLOVE BIOGEL PI IND STRL 7.0 (GLOVE) IMPLANT
GLOVE BIOGEL PI IND STRL 7.5 (GLOVE) ×1 IMPLANT
GLOVE BIOGEL PI INDICATOR 7.0 (GLOVE) ×4
GLOVE BIOGEL PI INDICATOR 7.5 (GLOVE) ×2
GOWN STRL REUS W/ TWL LRG LVL3 (GOWN DISPOSABLE) ×2 IMPLANT
GOWN STRL REUS W/TWL LRG LVL3 (GOWN DISPOSABLE) ×6
MESH ULTRAPRO 3X6 7.6X15CM (Mesh General) ×2 IMPLANT
NEEDLE HYPO 22GX1.5 SAFETY (NEEDLE) ×3 IMPLANT
NS IRRIG 1000ML POUR BTL (IV SOLUTION) ×2 IMPLANT
PACK BASIN DAY SURGERY FS (CUSTOM PROCEDURE TRAY) ×3 IMPLANT
PENCIL BUTTON HOLSTER BLD 10FT (ELECTRODE) ×3 IMPLANT
SLEEVE SCD COMPRESS KNEE MED (MISCELLANEOUS) ×2 IMPLANT
SPONGE LAP 4X18 RFD (DISPOSABLE) ×3 IMPLANT
STRIP CLOSURE SKIN 1/2X4 (GAUZE/BANDAGES/DRESSINGS) ×1 IMPLANT
SUT MNCRL AB 4-0 PS2 18 (SUTURE) ×3 IMPLANT
SUT SILK 2 0 SH (SUTURE) IMPLANT
SUT VIC AB 0 SH 27 (SUTURE) IMPLANT
SUT VIC AB 2-0 SH 18 (SUTURE) ×5 IMPLANT
SUT VIC AB 2-0 SH 27 (SUTURE)
SUT VIC AB 2-0 SH 27XBRD (SUTURE) IMPLANT
SUT VIC AB 3-0 SH 27 (SUTURE) ×3
SUT VIC AB 3-0 SH 27X BRD (SUTURE) ×1 IMPLANT
SUT VICRYL AB 3 0 TIES (SUTURE) ×2 IMPLANT
SYR CONTROL 10ML LL (SYRINGE) ×3 IMPLANT
TOWEL GREEN STERILE FF (TOWEL DISPOSABLE) ×3 IMPLANT

## 2018-09-04 NOTE — Progress Notes (Signed)
Assisted Dr. Howze with right, ultrasound guided, transabdominal plane block. Side rails up, monitors on throughout procedure. See vital signs in flow sheet. Tolerated Procedure well. 

## 2018-09-04 NOTE — Anesthesia Procedure Notes (Signed)
Anesthesia Regional Block: TAP block   Pre-Anesthetic Checklist: ,, timeout performed, Correct Patient, Correct Site, Correct Laterality, Correct Procedure, Correct Position, site marked, Risks and benefits discussed, pre-op evaluation,  At surgeon's request and post-op pain management  Laterality: Right  Prep: Maximum Sterile Barrier Precautions used, chloraprep       Needles:  Injection technique: Single-shot  Needle Type: Echogenic Stimulator Needle     Needle Length: 9cm  Needle Gauge: 21     Additional Needles:   Narrative:  Start time: 09/04/2018 8:25 AM End time: 09/04/2018 8:28 AM Injection made incrementally with aspirations every 5 mL. Anesthesiologist: Brennan Bailey, MD  Additional Notes: Risks, benefits, and alternative discussed. Patient gave consent for procedure. Patient prepped and draped in sterile fashion. Sedation administered, patient remains easily responsive to voice. Relevant anatomy identified with ultrasound guidance. Local anesthetic given in 5cc increments with no signs or symptoms of intravascular injection. No pain or paraesthesias with injection. Patient monitored throughout procedure with signs of LAST or immediate complications. Tolerated well. Ultrasound image placed in chart. Tawny Asal, MD

## 2018-09-04 NOTE — Anesthesia Procedure Notes (Signed)
Procedure Name: Intubation Date/Time: 09/04/2018 8:54 AM Performed by: Maryella Shivers, CRNA Pre-anesthesia Checklist: Patient identified, Emergency Drugs available, Suction available and Patient being monitored Patient Re-evaluated:Patient Re-evaluated prior to induction Oxygen Delivery Method: Circle system utilized Preoxygenation: Pre-oxygenation with 100% oxygen Induction Type: IV induction Ventilation: Mask ventilation without difficulty Grade View: Grade II Tube type: Oral Tube size: 8.0 mm Number of attempts: 1 Airway Equipment and Method: Stylet,  Oral airway and Video-laryngoscopy Placement Confirmation: ETT inserted through vocal cords under direct vision,  positive ETCO2 and breath sounds checked- equal and bilateral Secured at: 21 cm Tube secured with: Tape Dental Injury: Teeth and Oropharynx as per pre-operative assessment

## 2018-09-04 NOTE — Op Note (Signed)
Preoperative diagnosis: Right inguinal hernia Postoperative diagnosis: Pantaloon right inguinal hernia Procedure: Right inguinal hernia repair with Ultrapro mesh Surgeon: Dr. Serita Grammes Estimated blood loss: 10 cc Anesthesia: General with a TAP block Specimens: None Drains: None Complications: None Sponge needle count was correct at completion Disposition to recovery stable condition  Indications: This is 73 year old male I know from a prior umbilical hernia repair who has a symptomatic right inguinal hernia.  This was a scrotal hernia on his exam in the office.  We discussed an open repair with mesh.  Procedure: After informed consent was obtained the patient first underwent a tap block by anesthesia.  He was given antibiotics.  SCDs were in place.  He was placed under general anesthesia without complication.  He was prepped and draped in the standard sterile surgical fashion.  Surgical timeout was then performed.  I filtrated Marcaine on the line of an incision.  I then made a right groin incision.  I cauterized the inferior epigastric vessels.  I then identified the external oblique.  I entered this through the external ring.  I was then able to identify what was a very large hernia extending into his scrotum.  I eventually was able to encircle his spermatic cord and the hernia contents.  He had a very small direct hernia as well.  He had a large cord lipoma that I dissected free and excised tied this off with 2-0 Vicryl suture.  He then has so had a very large hernia sac that I was able to reduce the contents and I ligated and divided the hernia sac tying this off with a 2-0 Vicryl suture as well.  I then tightened his internal ring with a 2-0 Vicryl suture.  I elected to close his floor with 2-0 Vicryl putting the internal oblique to the shelving edge.  I then placed an ultra pro mesh patch cut to fit the area.  I sutured to the pubic tubercle with 2-0 Vicryl in several positions.  I then  sutured it to the inguinal ligament inferiorly every half centimeter.  I then made a T cut and wrapped this around the spermatic cord.  I sutured this to the fascia superiorly and closed the T cut as well.  The mesh was in good position.  I provided good coverage and there is no evidence of any hernia.  Hemostasis was observed.  I then closed the external oblique with 2-0 Vicryl.  The Scarpa's fascia was closed with 3-0 Vicryl and the skin with 4-0 Monocryl.  Glue and Steri-Strips were applied.  He tolerated this well was extubated and transferred to recovery in stable condition.

## 2018-09-04 NOTE — Interval H&P Note (Signed)
History and Physical Interval Note:  09/04/2018 8:19 AM  [name] [name]  has presented today for surgery, with the diagnosis of RIGHT INGUINAL HERNIA.  The various methods of treatment have been discussed with the patient and family. After consideration of risks, benefits and other options for treatment, the patient has consented to  Procedure(s) with comments: Harrison (Right) - GENERAL AND TAP BLOCK as a surgical intervention.  The patient's history has been reviewed, patient examined, no change in status, stable for surgery.  I have reviewed the patient's chart and labs.  Questions were answered to the patient's satisfaction.     Rolm Bookbinder

## 2018-09-04 NOTE — H&P (Signed)
[age] yom I know from prior uh who now complains of a couple months of right groin bulge. it goes away at night. causing some discomfort and a little activity limiting. no issues urinating. some conspitation. no issues with his nephrolithiasis and has stable bph. he would like to consider repair   Past Surgical History  Open Inguinal Hernia Surgery  Left. Vasectomy  Ventral / Umbilical Hernia Surgery  Right.  Diagnostic Studies History  Colonoscopy  never  Allergies No Known Drug Allergies  Allergies Reconciled   Medication History Medications Reconciled  Social History  Alcohol use  Occasional alcohol use. Caffeine use  Carbonated beverages, Coffee. No drug use  Tobacco use  Never smoker.  Family History  Arthritis  Father, Mother. Bleeding disorder  Mother. Breast Cancer  Mother. Diabetes Mellitus  Father. Heart Disease  Father. Ischemic Bowel Disease  Mother. Thyroid problems  Mother.  Other Problems  Enlarged Prostate  Inguinal Hernia  Kidney Stone  Umbilical Hernia Repair    Review of Systems  General Not Present- Appetite Loss, Chills, Fatigue, Fever, Night Sweats, Weight Gain and Weight Loss. Skin Not Present- Change in Wart/Mole, Dryness, Hives, Jaundice, New Lesions, Non-Healing Wounds, Rash and Ulcer. HEENT Not Present- Earache, Hearing Loss, Hoarseness, Nose Bleed, Oral Ulcers, Ringing in the Ears, Seasonal Allergies, Sinus Pain, Sore Throat, Visual Disturbances, Wears glasses/contact lenses and Yellow Eyes. Respiratory Not Present- Bloody sputum, Chronic Cough, Difficulty Breathing, Snoring and Wheezing. Breast Not Present- Breast Mass, Breast Pain, Nipple Discharge and Skin Changes. Cardiovascular Not Present- Chest Pain, Difficulty Breathing Lying Down, Leg Cramps, Palpitations, Rapid Heart Rate, Shortness of Breath and Swelling of Extremities. Gastrointestinal Not Present- Abdominal Pain, Bloating, Bloody Stool, Change in Bowel  Habits, Chronic diarrhea, Constipation, Difficulty Swallowing, Excessive gas, Gets full quickly at meals, Hemorrhoids, Indigestion, Nausea, Rectal Pain and Vomiting. Male Genitourinary Not Present- Blood in Urine, Change in Urinary Stream, Frequency, Impotence, Nocturia, Painful Urination, Urgency and Urine Leakage. Musculoskeletal Not Present- Back Pain, Joint Pain, Joint Stiffness, Muscle Pain, Muscle Weakness and Swelling of Extremities. Neurological Not Present- Decreased Memory, Fainting, Headaches, Numbness, Seizures, Tingling, Tremor, Trouble walking and Weakness. Psychiatric Not Present- Anxiety, Bipolar, Change in Sleep Pattern, Depression, Fearful and Frequent crying. Endocrine Not Present- Cold Intolerance, Excessive Hunger, Hair Changes, Heat Intolerance, Hot flashes and New Diabetes. Hematology Not Present- Blood Thinners, Easy Bruising, Excessive bleeding, Gland problems, HIV and Persistent Infections.   Physical Exam  General Mental Status-Alert. Orientation-Oriented X3. Head and Neck Trachea-midline. Eye Sclera/Conjunctiva - Bilateral-No scleral icterus. Chest and Lung Exam Chest and lung exam reveals -quiet, even and easy respiratory effort with no use of accessory muscles. Cardiovascular Cardiovascular examination reveals -normal heart sounds, regular rate and rhythm with no murmurs. Abdomen Note: soft no lih, no uh both with healed scars reducible nearly scrotal rih, nontender Neurologic Neurologic evaluation reveals -alert and oriented x 3 with no impairment of recent or remote memory.  Assessment & Plan  RIGHT INGUINAL HERNIA (K40.90) Story: Right inguinal hernia repair with mesh We discussed observation versus repair. We discussed both laparoscopic and open inguinal hernia repairs. I described the procedure in detail. The patient was given educational material. Goals should be achieved with surgery. We discussed the usage of mesh and the rationale  behind that. We went over the pathophysiology of inguinal hernia. We have elected to perform open inguinal hernia repair with mesh. We discussed the risks including bleeding, infection, recurrence, postoperative pain and chronic groin pain, testicular injury, urinary retention, numbness in groin and  around incision.

## 2018-09-04 NOTE — Anesthesia Postprocedure Evaluation (Signed)
Anesthesia Post Note  Patient: Dalbert Mayotte  Procedure(s) Performed: RIGHT INGUINAL HERNIA REPAIR WITH MESH (Right Groin)     Patient location during evaluation: PACU Anesthesia Type: General Level of consciousness: awake and alert and oriented Pain management: pain level controlled Vital Signs Assessment: post-procedure vital signs reviewed and stable Respiratory status: spontaneous breathing, nonlabored ventilation and respiratory function stable Cardiovascular status: blood pressure returned to baseline Postop Assessment: no apparent nausea or vomiting Anesthetic complications: no    Last Vitals:  Vitals:   09/04/18 1045 09/04/18 1100  BP: (!) 141/78 (!) 150/80  Pulse: 71 70  Resp: 17 14  Temp:    SpO2: 100% 95%    Last Pain:  Vitals:   09/04/18 1100  TempSrc:   PainSc: 0-No pain                 Brennan Bailey

## 2018-09-04 NOTE — Discharge Instructions (Signed)
Post Anesthesia Home Care Instructions  Activity: Get plenty of rest for the remainder of the day. A responsible individual must stay with you for 24 hours following the procedure.  For the next 24 hours, DO NOT: -Drive a car -Paediatric nurse -Drink alcoholic beverages -Take any medication unless instructed by your physician -Make any legal decisions or sign important papers.  Meals: Start with liquid foods such as gelatin or soup. Progress to regular foods as tolerated. Avoid greasy, spicy, heavy foods. If nausea and/or vomiting occur, drink only clear liquids until the nausea and/or vomiting subsides. Call your physician if vomiting continues.  Special Instructions/Symptoms: Your throat may feel dry or sore from the anesthesia or the breathing tube placed in your throat during surgery. If this causes discomfort, gargle with warm salt water. The discomfort should disappear within 24 hours.  If you had a scopolamine patch placed behind your ear for the management of post- operative nausea and/or vomiting:  1. The medication in the patch is effective for 72 hours, after which it should be removed.  Wrap patch in a tissue and discard in the trash. Wash hands thoroughly with soap and water. 2. You may remove the patch earlier than 72 hours if you experience unpleasant side effects which may include dry mouth, dizziness or visual disturbances. 3. Avoid touching the patch. Wash your hands with soap and water after contact with the patch.    [facility]  UMBILICAL OR INGUINAL HERNIA REPAIR: POST OP INSTRUCTIONS  Always review your discharge instruction sheet given to you by the facility where your surgery was performed. IF YOU HAVE DISABILITY OR FAMILY LEAVE FORMS, YOU MUST BRING THEM TO THE OFFICE FOR PROCESSING.   DO NOT GIVE THEM TO YOUR DOCTOR.  1. A  prescription for pain medication may be given to you upon discharge.  Take your pain medication as prescribed,  if needed.  If narcotic pain medicine is not needed, then you may take acetaminophen (Tylenol), naprosyn (Alleve) or ibuprofen (Advil) as needed. 2. Take your usually prescribed medications unless otherwise directed. 3. If you need a refill on your pain medication, please contact your pharmacy.  They will contact our office to request authorization. Prescriptions will not be filled after 5 pm or on week-ends. 4. You should follow a light diet the first 24 hours after arrival home, such as soup and crackers, etc.  Be sure to include lots of fluids daily.  Resume your normal diet the day after surgery. 5. Most patients will experience some swelling and bruising around the umbilicus or in the groin and scrotum.  Ice packs and reclining will help.  Swelling and bruising can take several days to resolve.  6. It is common to experience some constipation if taking pain medication after surgery.  Increasing fluid intake and taking a stool softener (such as Colace) will usually help or prevent this problem from occurring.  A mild laxative (Milk of Magnesia or Miralax) should be taken according to package directions if there are no bowel movements after 48 hours. 7. Unless discharge instructions indicate otherwise, you may remove your bandages 48 hours after surgery, and you may shower at that time.  You may have steri-strips (small skin tapes) in place directly over the incision.  These strips should be left on the skin for 7-10 days and will come off on their own.  If your surgeon used skin glue on the incision, you may shower in 24 hours.  The glue  will flake off over the next 2-3 weeks.  Any sutures or staples will be removed at the office during your follow-up visit. 8. ACTIVITIES:  You may resume regular (light) daily activities beginning the next day--such as daily self-care, walking, climbing stairs--gradually increasing activities as tolerated.  You may have sexual intercourse when it is comfortable.  Refrain  from any heavy lifting or straining until approved by your doctor. a. You may drive when you are no longer taking prescription pain medication, you can comfortably wear a seatbelt, and you can safely maneuver your car and apply brakes. b. RETURN TO WORK:  __________________________________________________________ 9. You should see your doctor in the office for a follow-up appointment approximately 2-3 weeks after your surgery.  Make sure that you call for this appointment within a day or two after you arrive home to insure a convenient appointment time. 10. OTHER INSTRUCTIONS:  __________________________________________________________________________________________________________________________________________________________________________________________  WHEN TO CALL YOUR DOCTOR: 1. Fever over 101.0 2. Inability to urinate 3. Nausea and/or vomiting 4. Extreme swelling or bruising 5. Continued bleeding from incision. 6. Increased pain, redness, or drainage from the incision  The clinic staff is available to answer your questions during regular business hours.  Please dont hesitate to call and ask to speak to one of the nurses for clinical concerns.  If you have a medical emergency, go to the nearest emergency room or call 911.  [facility] surgeon from [facility] is always on call at the hospital   [address], [address], [place], [place]  [address] ?  [address] [address], [place], [place]   [id number] [phone] ? [phone] ? FAX [phone]) [phone] Web site: [email or web address]

## 2018-09-04 NOTE — Transfer of Care (Signed)
Immediate Anesthesia Transfer of Care Note  Patient: [name] [name]  Procedure(s) Performed: RIGHT INGUINAL HERNIA REPAIR WITH MESH (Right Groin)  Patient Location: PACU  Anesthesia Type:GA combined with regional for post-op pain  Level of Consciousness: sedated  Airway & Oxygen Therapy: Patient Spontanous Breathing and Patient connected to nasal cannula oxygen  Post-op Assessment: Report given to RN and Post -op Vital signs reviewed and stable  Post vital signs: Reviewed and stable  Last Vitals:  Vitals Value Taken Time  BP 154/78 09/04/18 1020  Temp    Pulse 65 09/04/18 1021  Resp 15 09/04/18 1021  SpO2 97 % 09/04/18 1021  Vitals shown include unvalidated device data.  Last Pain:  Vitals:   09/04/18 0830  TempSrc:   PainSc: 0-No pain         Complications: No apparent anesthesia complications

## 2018-09-05 ENCOUNTER — Encounter (HOSPITAL_BASED_OUTPATIENT_CLINIC_OR_DEPARTMENT_OTHER): Payer: Self-pay | Admitting: General Surgery

## 2018-11-26 ENCOUNTER — Other Ambulatory Visit: Payer: Self-pay

## 2018-11-26 ENCOUNTER — Ambulatory Visit (INDEPENDENT_AMBULATORY_CARE_PROVIDER_SITE_OTHER): Payer: Medicare Other

## 2018-11-26 DIAGNOSIS — Z23 Encounter for immunization: Secondary | ICD-10-CM

## 2019-01-09 ENCOUNTER — Ambulatory Visit (INDEPENDENT_AMBULATORY_CARE_PROVIDER_SITE_OTHER): Payer: Medicare Other | Admitting: Internal Medicine

## 2019-01-09 ENCOUNTER — Encounter: Payer: Self-pay | Admitting: Internal Medicine

## 2019-01-09 ENCOUNTER — Other Ambulatory Visit (INDEPENDENT_AMBULATORY_CARE_PROVIDER_SITE_OTHER): Payer: Medicare Other

## 2019-01-09 ENCOUNTER — Other Ambulatory Visit: Payer: Self-pay

## 2019-01-09 ENCOUNTER — Other Ambulatory Visit: Payer: Self-pay | Admitting: Internal Medicine

## 2019-01-09 VITALS — BP 132/86 | HR 74 | Temp 98.5°F | Ht 66.0 in | Wt 155.0 lb

## 2019-01-09 DIAGNOSIS — Z0000 Encounter for general adult medical examination without abnormal findings: Secondary | ICD-10-CM | POA: Diagnosis not present

## 2019-01-09 DIAGNOSIS — R739 Hyperglycemia, unspecified: Secondary | ICD-10-CM | POA: Diagnosis not present

## 2019-01-09 DIAGNOSIS — E785 Hyperlipidemia, unspecified: Secondary | ICD-10-CM

## 2019-01-09 LAB — CBC WITH DIFFERENTIAL/PLATELET
Basophils Absolute: 0.1 10*3/uL (ref 0.0–0.1)
Basophils Relative: 1.4 % (ref 0.0–3.0)
Eosinophils Absolute: 0.3 10*3/uL (ref 0.0–0.7)
Eosinophils Relative: 5.2 % — ABNORMAL HIGH (ref 0.0–5.0)
HCT: 44 % (ref 39.0–52.0)
Hemoglobin: 14.9 g/dL (ref 13.0–17.0)
Lymphocytes Relative: 29.7 % (ref 12.0–46.0)
Lymphs Abs: 1.9 10*3/uL (ref 0.7–4.0)
MCHC: 33.8 g/dL (ref 30.0–36.0)
MCV: 93.6 fl (ref 78.0–100.0)
Monocytes Absolute: 0.8 10*3/uL (ref 0.1–1.0)
Monocytes Relative: 12.1 % — ABNORMAL HIGH (ref 3.0–12.0)
Neutro Abs: 3.3 10*3/uL (ref 1.4–7.7)
Neutrophils Relative %: 51.6 % (ref 43.0–77.0)
Platelets: 241 10*3/uL (ref 150.0–400.0)
RBC: 4.7 Mil/uL (ref 4.22–5.81)
RDW: 13.1 % (ref 11.5–15.5)
WBC: 6.4 10*3/uL (ref 4.0–10.5)

## 2019-01-09 LAB — TSH: TSH: 0.98 u[IU]/mL (ref 0.35–4.50)

## 2019-01-09 LAB — IBC PANEL
Iron: 63 ug/dL (ref 42–165)
Saturation Ratios: 16.8 % — ABNORMAL LOW (ref 20.0–50.0)
Transferrin: 268 mg/dL (ref 212.0–360.0)

## 2019-01-09 LAB — BASIC METABOLIC PANEL
BUN: 15 mg/dL (ref 6–23)
CO2: 24 mEq/L (ref 19–32)
Calcium: 8.9 mg/dL (ref 8.4–10.5)
Chloride: 105 mEq/L (ref 96–112)
Creatinine, Ser: 0.8 mg/dL (ref 0.40–1.50)
GFR: 94.98 mL/min (ref >60.00)
Glucose, Bld: 104 mg/dL — ABNORMAL HIGH (ref 70–99)
Potassium: 3.7 mEq/L (ref 3.5–5.1)
Sodium: 139 mEq/L (ref 135–145)

## 2019-01-09 LAB — LIPID PANEL
Cholesterol: 197 mg/dL (ref 0–200)
HDL: 36.5 mg/dL — ABNORMAL LOW (ref >39.00)
LDL Cholesterol: 146 mg/dL — ABNORMAL HIGH (ref 0–99)
NonHDL: 160.86
Total CHOL/HDL Ratio: 5
Triglycerides: 72 mg/dL (ref 0.0–149.0)
VLDL: 14.4 mg/dL (ref 0.0–40.0)

## 2019-01-09 LAB — HEPATIC FUNCTION PANEL
ALT: 12 U/L (ref 0–53)
AST: 15 U/L (ref 0–37)
Albumin: 4.1 g/dL (ref 3.5–5.2)
Alkaline Phosphatase: 78 U/L (ref 39–117)
Bilirubin, Direct: 0.1 mg/dL (ref 0.0–0.3)
Total Bilirubin: 0.8 mg/dL (ref 0.2–1.2)
Total Protein: 6.8 g/dL (ref 6.0–8.3)

## 2019-01-09 LAB — VITAMIN D 25 HYDROXY (VIT D DEFICIENCY, FRACTURES): VITD: 33.55 ng/mL (ref 30.00–100.00)

## 2019-01-09 LAB — VITAMIN B12: Vitamin B-12: 205 pg/mL — ABNORMAL LOW (ref 211–911)

## 2019-01-09 LAB — HEMOGLOBIN A1C: Hgb A1c MFr Bld: 6.1 % (ref 4.6–6.5)

## 2019-01-09 MED ORDER — ZOSTER VAC RECOMB ADJUVANTED 50 MCG/0.5ML IM SUSR: 0.5000 mL | Freq: Once | INTRAMUSCULAR | 1 refills | Status: AC

## 2019-01-09 MED ORDER — ROSUVASTATIN CALCIUM 10 MG PO TABS: 10.0000 mg | ORAL_TABLET | Freq: Every day | ORAL | 3 refills | Status: DC

## 2019-01-09 NOTE — Progress Notes (Signed)
Subjective:    Patient ID: [name] [name] [name], male    DOB: 25-Jul-1945, 73 y.o.   MRN: [id number]  HPI  Here for wellness and f/u;  Overall doing ok;  Pt denies Chest pain, worsening SOB, DOE, wheezing, orthopnea, PND, worsening LE edema, palpitations, dizziness or syncope.  Pt denies neurological change such as new headache, facial or extremity weakness.  Pt denies polydipsia, polyuria, or low sugar symptoms. Pt states overall good compliance with treatment and medications, good tolerability, and has been trying to follow appropriate diet.  Pt denies worsening depressive symptoms, suicidal ideation or panic. [name] [name] fever, night sweats, wt loss, loss of appetite, or other constitutional symptoms.  Pt states good ability with ADL's, has low fall risk, home safety reviewed and adequate, [name] [name] other significant changes in hearing or vision, and only occasionally active with exercise. S/p [facility] surgury Dr [name] [name] [name] in July 2020. Willing to try crestor 10 mg if needed.  Saw urology today with UA and PSA.  [name] [name] new complaints Past Medical History:  Diagnosis Date  . History of kidney stones   . Hyperlipidemia   . Osteoarthritis   . Right inguinal hernia   . Umbilical hernia    Past Surgical History:  Procedure Laterality Date  . EXTRACORPOREAL SHOCK WAVE LITHOTRIPSY Left 10/29/2016   Procedure: LEFT EXTRACORPOREAL SHOCK WAVE LITHOTRIPSY (ESWL);  Surgeon: [name] Laser, MD;  Location: [facility];  Service: Urology;  Laterality: Left;  . HERNIA REPAIR     [facility]  . HERNIA REPAIR  12/06/10   umbilical hernia   . INGUINAL HERNIA REPAIR Right 09/04/2018   Procedure: RIGHT INGUINAL HERNIA REPAIR WITH MESH;  Surgeon: [name] Loron, MD;  Location: [facility];  Service: General;  Laterality: Right;  Marland Kitchen VASECTOMY      reports that he has never smoked. He has never used smokeless tobacco. He reports current alcohol use. He reports that he does not use drugs. family history is not on file. Allergies   Allergen Reactions  . Lipitor [Atorvastatin] Other (See Comments)    "feels funny inside"   Current Outpatient Medications on File Prior to Visit  Medication Sig Dispense Refill  . oxyCODONE (OXY IR/ROXICODONE) 5 MG immediate release tablet Take 1 tablet (5 mg total) by mouth every 6 (six) hours as needed for moderate pain, severe pain or breakthrough pain. 12 tablet 0   [name] [name] current facility-administered medications on file prior to visit.    Review of Systems Constitutional: Negative for other unusual diaphoresis, sweats, appetite or weight changes HENT: Negative for other worsening hearing loss, ear pain, facial swelling, mouth sores or neck stiffness.   Eyes: Negative for other worsening pain, redness or other visual disturbance.  Respiratory: Negative for other stridor or swelling Cardiovascular: Negative for other palpitations or other chest pain  Gastrointestinal: Negative for worsening diarrhea or loose stools, blood in stool, distention or other pain Genitourinary: Negative for hematuria, flank pain or other change in urine volume.  Musculoskeletal: Negative for myalgias or other joint swelling.  Skin: Negative for other color change, or other wound or worsening drainage.  Neurological: Negative for other syncope or numbness. Hematological: Negative for other adenopathy or swelling Psychiatric/Behavioral: Negative for hallucinations, other worsening agitation, SI, self-injury, or new decreased concentration All otherwise neg per pt     Objective:   Physical Exam BP 132/86   Pulse 74   Temp 98.5 F (36.9 C) (Oral)   Ht 5\' 6"  (1.676 m)   Wt 155 lb (  70.3 kg)   SpO2 97%   BMI 25.02 kg/m  VS noted,  Constitutional: Pt is oriented to person, place, and time. Appears well-developed and well-nourished, in [name] [name] significant distress and comfortable Head: Normocephalic and atraumatic  Eyes: Conjunctivae and EOM are normal. Pupils are equal, round, and reactive to light Right Ear:  External ear normal without discharge Left Ear: External ear normal without discharge Nose: Nose without discharge or deformity Mouth/Throat: Oropharynx is without other ulcerations and moist  Neck: Normal range of motion. Neck supple. [name] [name] JVD present. [name] [name] tracheal deviation present or significant neck LA or mass Cardiovascular: Normal rate, regular rhythm, normal heart sounds and intact distal pulses.   Pulmonary/Chest: WOB normal and breath sounds without rales or wheezing  Abdominal: Soft. Bowel sounds are normal. NT. [name] [name] HSM  Musculoskeletal: Normal range of motion. Exhibits [name] [name] edema Lymphadenopathy: Has [name] [name] other cervical adenopathy.  Neurological: Pt is alert and oriented to person, place, and time. Pt has normal reflexes. [name] [name] cranial nerve deficit. Motor grossly intact, Gait intact Skin: Skin is warm and dry. [name] [name] rash noted or new ulcerations Psychiatric:  Has normal mood and affect. Behavior is normal without agitation All otherwise neg per pt Lab Results  Component Value Date   WBC 6.4 01/09/2019   HGB 14.9 01/09/2019   HCT 44.0 01/09/2019   PLT 241.0 01/09/2019   GLUCOSE 104 (H) 01/09/2019   CHOL 197 01/09/2019   TRIG 72.0 01/09/2019   HDL 36.50 (L) 01/09/2019   LDLCALC 146 (H) 01/09/2019   ALT 12 01/09/2019   AST 15 01/09/2019   NA 139 01/09/2019   K 3.7 01/09/2019   CL 105 01/09/2019   CREATININE 0.80 01/09/2019   BUN 15 01/09/2019   CO2 24 01/09/2019   TSH 0.98 01/09/2019   PSA 14.20 (H) 09/21/2016   HGBA1C 6.1 01/09/2019       Assessment & Plan:

## 2019-01-09 NOTE — Patient Instructions (Addendum)
Your shingles shot prescription was sent to the pharmacy  Please continue all other medications as before, and refills have been done if requested.  Please have the pharmacy call with any other refills you may need.  Please continue your efforts at being more active, low cholesterol diet, and weight control.  You are otherwise up to date with prevention measures today.  Please keep your appointments with your specialists as you may have planned  Please go to the LAB in the Basement (turn left off the elevator) for the tests to be done today  You will be contacted by phone if any changes need to be made immediately.  Otherwise, you will receive a letter about your results with an explanation, but please check with MyChart first.  Please remember to sign up for MyChart if you have not done so, as this will be important to you in the future with finding out test results, communicating by private email, and scheduling acute appointments online when needed.  Please return in 1 year for your yearly visit, or sooner if needed, with Lab testing done 3-5 days before  

## 2019-01-10 ENCOUNTER — Encounter: Payer: Self-pay | Admitting: Internal Medicine

## 2019-01-10 NOTE — Assessment & Plan Note (Signed)
stable overall by history and exam, recent data reviewed with pt, and pt to continue medical treatment as before,  to f/u any worsening symptoms or concerns, consider crestor

## 2019-01-10 NOTE — Assessment & Plan Note (Signed)
stable overall by history and exam, recent data reviewed with pt, and pt to continue medical treatment as before,  to f/u any worsening symptoms or concerns  

## 2019-01-10 NOTE — Assessment & Plan Note (Signed)

## 2019-01-14 DIAGNOSIS — N200 Calculus of kidney: Secondary | ICD-10-CM | POA: Diagnosis not present

## 2019-01-15 ENCOUNTER — Other Ambulatory Visit: Payer: Self-pay | Admitting: Urology

## 2019-01-15 DIAGNOSIS — R972 Elevated prostate specific antigen [PSA]: Secondary | ICD-10-CM

## 2019-01-21 ENCOUNTER — Ambulatory Visit (INDEPENDENT_AMBULATORY_CARE_PROVIDER_SITE_OTHER): Payer: Medicare Other

## 2019-01-21 ENCOUNTER — Other Ambulatory Visit: Payer: Self-pay

## 2019-01-21 DIAGNOSIS — E538 Deficiency of other specified B group vitamins: Secondary | ICD-10-CM | POA: Diagnosis not present

## 2019-01-21 MED ORDER — CYANOCOBALAMIN 1000 MCG/ML IJ SOLN
1000.0000 ug | Freq: Once | INTRAMUSCULAR | Status: AC
  Administered 2019-01-21: 16:00:00 1000 ug via INTRAMUSCULAR

## 2019-01-21 NOTE — Progress Notes (Signed)
Medical screening examination/treatment/procedure(s) were performed by non-physician practitioner and as supervising physician I was immediately available for consultation/collaboration. I agree with above. Avalin Briley, MD   

## 2019-02-13 ENCOUNTER — Ambulatory Visit
Admission: RE | Admit: 2019-02-13 | Discharge: 2019-02-13 | Disposition: A | Discharge status: Home / Self Care | Payer: Medicare Other | Source: Ambulatory Visit | Attending: Urology | Admitting: Urology

## 2019-02-13 ENCOUNTER — Other Ambulatory Visit: Payer: Self-pay

## 2019-02-13 DIAGNOSIS — R972 Elevated prostate specific antigen [PSA]: Secondary | ICD-10-CM

## 2019-02-13 MED ORDER — GADOBENATE DIMEGLUMINE 529 MG/ML IV SOLN
14.0000 mL | Freq: Once | INTRAVENOUS | Status: AC | PRN
  Administered 2019-02-13: 14 mL via INTRAVENOUS

## 2019-02-20 ENCOUNTER — Other Ambulatory Visit: Payer: Self-pay

## 2019-02-20 ENCOUNTER — Ambulatory Visit (INDEPENDENT_AMBULATORY_CARE_PROVIDER_SITE_OTHER): Payer: Medicare Other

## 2019-02-20 DIAGNOSIS — E538 Deficiency of other specified B group vitamins: Secondary | ICD-10-CM

## 2019-02-20 MED ORDER — CYANOCOBALAMIN 1000 MCG/ML IJ SOLN
1000.0000 ug | Freq: Once | INTRAMUSCULAR | Status: AC
  Administered 2019-02-20: 1000 ug via INTRAMUSCULAR

## 2019-02-20 NOTE — Progress Notes (Signed)
Medical screening examination/treatment/procedure(s) were performed by non-physician practitioner and as supervising physician I was immediately available for consultation/collaboration. I agree with above. Raywood Wailes, MD   

## 2019-03-21 ENCOUNTER — Ambulatory Visit: Payer: Medicare Other | Attending: Internal Medicine

## 2019-03-21 DIAGNOSIS — Z23 Encounter for immunization: Secondary | ICD-10-CM

## 2019-03-21 NOTE — Progress Notes (Signed)
Covid-19 Vaccination Clinic  Name:  [name] [name]    MRN: [id number] DOB: 02/13/1945  03/21/2019  [name] was observed post Covid-19 immunization for 15 minutes without incidence. He was provided with Vaccine Information Sheet and instruction to access the V-Safe system.   [name] was instructed to call 911 with any severe reactions post vaccine: Marland Kitchen Difficulty breathing  . Swelling of your face and throat  . A fast heartbeat  . A bad rash all over your body  . Dizziness and weakness    Immunizations Administered    Name Date Dose VIS Date Route   Pfizer COVID-19 Vaccine 03/21/2019  8:36 AM 0.3 mL 01/16/2019 Intramuscular   Manufacturer: [employer], [employer]   Lot: [id number]   NDC: [address]-2

## 2019-03-22 ENCOUNTER — Ambulatory Visit: Payer: Medicare Other

## 2019-03-23 ENCOUNTER — Other Ambulatory Visit: Payer: Self-pay

## 2019-03-23 ENCOUNTER — Ambulatory Visit (INDEPENDENT_AMBULATORY_CARE_PROVIDER_SITE_OTHER): Payer: Medicare Other | Admitting: *Deleted

## 2019-03-23 DIAGNOSIS — E538 Deficiency of other specified B group vitamins: Secondary | ICD-10-CM | POA: Diagnosis not present

## 2019-03-23 MED ORDER — CYANOCOBALAMIN 1000 MCG/ML IJ SOLN
1000.0000 ug | Freq: Once | INTRAMUSCULAR | Status: AC
  Administered 2019-03-23: 1000 ug via INTRAMUSCULAR

## 2019-03-23 NOTE — Progress Notes (Signed)
Medical screening examination/treatment/procedure(s) were performed by non-physician practitioner and as supervising physician I was immediately available for consultation/collaboration. I agree with above. Devina Bezold, MD   

## 2019-04-13 ENCOUNTER — Ambulatory Visit: Payer: Medicare Other | Attending: Internal Medicine

## 2019-04-13 DIAGNOSIS — Z23 Encounter for immunization: Secondary | ICD-10-CM | POA: Insufficient documentation

## 2019-04-13 NOTE — Progress Notes (Signed)
Covid-19 Vaccination Clinic  Name:  [name] [name]    MRN: [id number] DOB: 08-22-1945  04/13/2019  [name] was observed post Covid-19 immunization for 15 minutes without incident. He was provided with Vaccine Information Sheet and instruction to access the V-Safe system.   [name] was instructed to call 911 with any severe reactions post vaccine: Marland Kitchen Difficulty breathing  . Swelling of face and throat  . A fast heartbeat  . A bad rash all over body  . Dizziness and weakness   Immunizations Administered    Name Date Dose VIS Date Route   Pfizer COVID-19 Vaccine 04/13/2019  1:05 PM 0.3 mL 01/16/2019 Intramuscular   Manufacturer: [employer], [employer]   Lot: [id number]   NDC: [address]-8

## 2019-04-20 ENCOUNTER — Ambulatory Visit (INDEPENDENT_AMBULATORY_CARE_PROVIDER_SITE_OTHER): Payer: Medicare Other | Admitting: *Deleted

## 2019-04-20 ENCOUNTER — Other Ambulatory Visit: Payer: Self-pay

## 2019-04-20 DIAGNOSIS — E538 Deficiency of other specified B group vitamins: Secondary | ICD-10-CM

## 2019-04-20 MED ORDER — CYANOCOBALAMIN 1000 MCG/ML IJ SOLN
1000.0000 ug | Freq: Once | INTRAMUSCULAR | Status: AC
  Administered 2019-04-20: 1000 ug via INTRAMUSCULAR

## 2019-04-20 NOTE — Progress Notes (Signed)
Pls cosign for B12 inj../lmb  

## 2019-05-21 ENCOUNTER — Other Ambulatory Visit: Payer: Self-pay

## 2019-05-21 ENCOUNTER — Ambulatory Visit (INDEPENDENT_AMBULATORY_CARE_PROVIDER_SITE_OTHER): Payer: Medicare Other | Admitting: *Deleted

## 2019-05-21 DIAGNOSIS — E538 Deficiency of other specified B group vitamins: Secondary | ICD-10-CM

## 2019-05-21 MED ORDER — CYANOCOBALAMIN 1000 MCG/ML IJ SOLN
1000.0000 ug | Freq: Once | INTRAMUSCULAR | Status: AC
  Administered 2019-05-21: 1000 ug via INTRAMUSCULAR

## 2019-05-21 NOTE — Progress Notes (Addendum)
Pls cosign for B12 since PCP is out of the office today.Marland KitchenRaechel [name]  I have reviewed and agree

## 2019-06-22 ENCOUNTER — Other Ambulatory Visit: Payer: Self-pay

## 2019-06-22 ENCOUNTER — Ambulatory Visit (INDEPENDENT_AMBULATORY_CARE_PROVIDER_SITE_OTHER): Payer: Medicare Other | Admitting: *Deleted

## 2019-06-22 ENCOUNTER — Telehealth: Payer: Self-pay

## 2019-06-22 DIAGNOSIS — E538 Deficiency of other specified B group vitamins: Secondary | ICD-10-CM | POA: Diagnosis not present

## 2019-06-22 DIAGNOSIS — R739 Hyperglycemia, unspecified: Secondary | ICD-10-CM

## 2019-06-22 DIAGNOSIS — Z0000 Encounter for general adult medical examination without abnormal findings: Secondary | ICD-10-CM

## 2019-06-22 MED ORDER — CYANOCOBALAMIN 1000 MCG/ML IJ SOLN
1000.0000 ug | Freq: Once | INTRAMUSCULAR | Status: AC
  Administered 2019-06-22: 1000 ug via INTRAMUSCULAR

## 2019-06-22 NOTE — Telephone Encounter (Signed)
Patient stopped at check out desk after B12 #6 and states that he was not sure what Dr Jonny Ruiz would like for him to do regarding his B12.  Patient scheduled for CPE on 01/12/2020, would like to get labs before this visit. Please advise.  States that the message could be sent through MyChart.

## 2019-06-22 NOTE — Progress Notes (Signed)
Pls cosign for B12 inj../lm,b 

## 2019-06-25 NOTE — Telephone Encounter (Signed)
Labs ordered  [facility] to hold further b12 shots  Will check b12 with next labs

## 2019-06-26 NOTE — Telephone Encounter (Signed)
Notified pt w/MD response.../lmb 

## 2019-07-17 DIAGNOSIS — N200 Calculus of kidney: Secondary | ICD-10-CM | POA: Diagnosis not present

## 2020-01-06 ENCOUNTER — Other Ambulatory Visit (INDEPENDENT_AMBULATORY_CARE_PROVIDER_SITE_OTHER): Payer: Medicare Other

## 2020-01-06 DIAGNOSIS — Z0000 Encounter for general adult medical examination without abnormal findings: Secondary | ICD-10-CM | POA: Diagnosis not present

## 2020-01-06 DIAGNOSIS — R739 Hyperglycemia, unspecified: Secondary | ICD-10-CM

## 2020-01-06 DIAGNOSIS — E538 Deficiency of other specified B group vitamins: Secondary | ICD-10-CM

## 2020-01-06 LAB — BASIC METABOLIC PANEL
BUN: 13 mg/dL (ref 6–23)
CO2: 27 mEq/L (ref 19–32)
Calcium: 8.9 mg/dL (ref 8.4–10.5)
Chloride: 103 mEq/L (ref 96–112)
Creatinine, Ser: 0.84 mg/dL (ref 0.40–1.50)
GFR: 86.74 mL/min (ref >60.00)
Glucose, Bld: 131 mg/dL — ABNORMAL HIGH (ref 70–99)
Potassium: 4 mEq/L (ref 3.5–5.1)
Sodium: 138 mEq/L (ref 135–145)

## 2020-01-06 LAB — LIPID PANEL
Cholesterol: 206 mg/dL — ABNORMAL HIGH (ref 0–200)
HDL: 40.8 mg/dL (ref >39.00)
LDL Cholesterol: 147 mg/dL — ABNORMAL HIGH (ref 0–99)
NonHDL: 164.76
Total CHOL/HDL Ratio: 5
Triglycerides: 91 mg/dL (ref 0.0–149.0)
VLDL: 18.2 mg/dL (ref 0.0–40.0)

## 2020-01-06 LAB — URINALYSIS, ROUTINE W REFLEX MICROSCOPIC
Bilirubin Urine: NEGATIVE
Hgb urine dipstick: NEGATIVE
Ketones, ur: NEGATIVE
Leukocytes,Ua: NEGATIVE
Nitrite: NEGATIVE
Specific Gravity, Urine: 1.025 (ref 1.000–1.030)
Total Protein, Urine: NEGATIVE
Urine Glucose: NEGATIVE
Urobilinogen, UA: 0.2 (ref 0.0–1.0)
pH: 6 (ref 5.0–8.0)

## 2020-01-06 LAB — CBC WITH DIFFERENTIAL/PLATELET
Basophils Absolute: 0.1 10*3/uL (ref 0.0–0.1)
Basophils Relative: 1 % (ref 0.0–3.0)
Eosinophils Absolute: 0.2 10*3/uL (ref 0.0–0.7)
Eosinophils Relative: 4.1 % (ref 0.0–5.0)
HCT: 45.8 % (ref 39.0–52.0)
Hemoglobin: 15.8 g/dL (ref 13.0–17.0)
Lymphocytes Relative: 26.8 % (ref 12.0–46.0)
Lymphs Abs: 1.6 10*3/uL (ref 0.7–4.0)
MCHC: 34.4 g/dL (ref 30.0–36.0)
MCV: 94 fl (ref 78.0–100.0)
Monocytes Absolute: 0.6 10*3/uL (ref 0.1–1.0)
Monocytes Relative: 10 % (ref 3.0–12.0)
Neutro Abs: 3.5 10*3/uL (ref 1.4–7.7)
Neutrophils Relative %: 58.1 % (ref 43.0–77.0)
Platelets: 273 10*3/uL (ref 150.0–400.0)
RBC: 4.87 Mil/uL (ref 4.22–5.81)
RDW: 13.5 % (ref 11.5–15.5)
WBC: 6 10*3/uL (ref 4.0–10.5)

## 2020-01-06 LAB — HEPATIC FUNCTION PANEL
ALT: 11 U/L (ref 0–53)
AST: 14 U/L (ref 0–37)
Albumin: 4.2 g/dL (ref 3.5–5.2)
Alkaline Phosphatase: 76 U/L (ref 39–117)
Bilirubin, Direct: 0.1 mg/dL (ref 0.0–0.3)
Total Bilirubin: 0.7 mg/dL (ref 0.2–1.2)
Total Protein: 7.1 g/dL (ref 6.0–8.3)

## 2020-01-06 LAB — TSH: TSH: 1.14 u[IU]/mL (ref 0.35–4.50)

## 2020-01-06 LAB — HEMOGLOBIN A1C: Hgb A1c MFr Bld: 6.1 % (ref 4.6–6.5)

## 2020-01-06 LAB — PSA: PSA: 12.35 ng/mL — ABNORMAL HIGH (ref 0.10–4.00)

## 2020-01-06 LAB — VITAMIN B12: Vitamin B-12: 263 pg/mL (ref 211–911)

## 2020-01-12 ENCOUNTER — Ambulatory Visit (INDEPENDENT_AMBULATORY_CARE_PROVIDER_SITE_OTHER): Payer: Medicare Other | Admitting: Internal Medicine

## 2020-01-12 ENCOUNTER — Other Ambulatory Visit: Payer: Self-pay

## 2020-01-12 VITALS — BP 120/80 | HR 67 | Temp 98.7°F | Ht 66.0 in | Wt 153.0 lb

## 2020-01-12 DIAGNOSIS — Z0000 Encounter for general adult medical examination without abnormal findings: Secondary | ICD-10-CM | POA: Diagnosis not present

## 2020-01-12 DIAGNOSIS — R739 Hyperglycemia, unspecified: Secondary | ICD-10-CM | POA: Diagnosis not present

## 2020-01-12 DIAGNOSIS — E538 Deficiency of other specified B group vitamins: Secondary | ICD-10-CM

## 2020-01-12 DIAGNOSIS — N200 Calculus of kidney: Secondary | ICD-10-CM | POA: Insufficient documentation

## 2020-01-12 DIAGNOSIS — M797 Fibromyalgia: Secondary | ICD-10-CM

## 2020-01-12 DIAGNOSIS — E7849 Other hyperlipidemia: Secondary | ICD-10-CM | POA: Diagnosis not present

## 2020-01-12 DIAGNOSIS — E559 Vitamin D deficiency, unspecified: Secondary | ICD-10-CM | POA: Diagnosis not present

## 2020-01-12 DIAGNOSIS — Z0001 Encounter for general adult medical examination with abnormal findings: Secondary | ICD-10-CM

## 2020-01-12 DIAGNOSIS — K219 Gastro-esophageal reflux disease without esophagitis: Secondary | ICD-10-CM

## 2020-01-12 NOTE — Progress Notes (Signed)
Subjective:    Patient ID: [name] [name], male    DOB: September 20, 1945, 74 y.o.   MRN: [id number]  HPI  Here for wellness and f/u;  Overall doing ok;  Pt denies Chest pain, worsening SOB, DOE, wheezing, orthopnea, PND, worsening LE edema, palpitations, dizziness or syncope.  Pt denies neurological change such as new headache, facial or extremity weakness.  Pt denies polydipsia, polyuria, or low sugar symptoms. Pt states overall good compliance with treatment and medications, good tolerability, and has been trying to follow appropriate diet.  Pt denies worsening depressive symptoms, suicidal ideation or panic. [name] fever, night sweats, wt loss, loss of appetite, or other constitutional symptoms.  Pt states good ability with ADL's, has low fall risk, home safety reviewed and adequate, [name] other significant changes in hearing or vision, and only occasionally active with exercise. Only takes the crestor a few times per wk but admits [name] hx of statin myopathy, just wary.  Denies worsening reflux, abd pain, dysphagia, n/v, bowel change or blood.  Denies urinary symptoms such as dysuria, frequency, urgency, flank pain, hematuria or n/v, fever, chills.  Pt denies fever, wt loss, night sweats, loss of appetite, or other constitutional symptoms Past Medical History:  Diagnosis Date  . History of kidney stones   . Hyperlipidemia   . Osteoarthritis   . Right inguinal hernia   . Umbilical hernia    Past Surgical History:  Procedure Laterality Date  . EXTRACORPOREAL SHOCK WAVE LITHOTRIPSY Left 10/29/2016   Procedure: LEFT EXTRACORPOREAL SHOCK WAVE LITHOTRIPSY (ESWL);  Surgeon: Hildred Laser, MD;  Location: [facility];  Service: Urology;  Laterality: Left;  . HERNIA REPAIR     LIH  . HERNIA REPAIR  12/06/10   umbilical hernia   . INGUINAL HERNIA REPAIR Right 09/04/2018   Procedure: RIGHT INGUINAL HERNIA REPAIR WITH MESH;  Surgeon: Emelia Loron, MD;  Location: [facility];  Service: General;   Laterality: Right;  Marland Kitchen VASECTOMY      reports that he has never smoked. He has never used smokeless tobacco. He reports current alcohol use. He reports that he does not use drugs. family history is not on file. Allergies  Allergen Reactions  . Lipitor [Atorvastatin] Other (See Comments)    "feels funny inside"   Current Outpatient Medications on File Prior to Visit  Medication Sig Dispense Refill  . oxyCODONE (OXY IR/ROXICODONE) 5 MG immediate release tablet Take 1 tablet (5 mg total) by mouth every 6 (six) hours as needed for moderate pain, severe pain or breakthrough pain. 12 tablet 0  . rosuvastatin (CRESTOR) 10 MG tablet Take 1 tablet (10 mg total) by mouth daily. 90 tablet 3   [name] current facility-administered medications on file prior to visit.   Review of Systems All otherwise neg per pt     Objective:   Physical Exam BP 120/80 (BP Location: Left Arm, Patient Position: Sitting, Cuff Size: Large)   Pulse 67   Temp 98.7 F (37.1 C) (Oral)   Ht 5\' 6"  (1.676 m)   Wt 153 lb (69.4 kg)   SpO2 97%   BMI 24.69 kg/m  VS noted,  Constitutional: Pt appears in NAD HENT: Head: NCAT.  Right Ear: External ear normal.  Left Ear: External ear normal.  Eyes: . Pupils are equal, round, and reactive to light. Conjunctivae and EOM are normal Nose: without d/c or deformity Neck: Neck supple. Gross normal ROM Cardiovascular: Normal rate and regular rhythm.   Pulmonary/Chest: Effort normal and  breath sounds without rales or wheezing.  Abd:  Soft, NT, ND, + BS, [name] organomegaly Neurological: Pt is alert. At baseline orientation, motor grossly intact Skin: Skin is warm. [name] rashes, other new lesions, [name] LE edema Psychiatric: Pt behavior is normal without agitation  All otherwise neg per pt Lab Results  Component Value Date   WBC 6.0 01/06/2020   HGB 15.8 01/06/2020   HCT 45.8 01/06/2020   PLT 273.0 01/06/2020   GLUCOSE 131 (H) 01/06/2020   CHOL 206 (H) 01/06/2020   TRIG 91.0 01/06/2020    HDL 40.80 01/06/2020   LDLCALC 147 (H) 01/06/2020   ALT 11 01/06/2020   AST 14 01/06/2020   NA 138 01/06/2020   K 4.0 01/06/2020   CL 103 01/06/2020   CREATININE 0.84 01/06/2020   BUN 13 01/06/2020   CO2 27 01/06/2020   TSH 1.14 01/06/2020   PSA 12.35 (H) 01/06/2020   HGBA1C 6.1 01/06/2020      Assessment & Plan:

## 2020-01-12 NOTE — Patient Instructions (Signed)
Please try taking the statin every night at bedtime  Please continue all other medications as before, and refills have been done if requested.  Please have the pharmacy call with any other refills you may need.  Please continue your efforts at being more active, low cholesterol diet, and weight control.  You are otherwise up to date with prevention measures today.  Please keep your appointments with your specialists as you may have planned  Please make an Appointment to return for your 1 year visit, or sooner if needed, with Lab testing by Appointment as well, to be done about 3-5 days before at the FIRST FLOOR Lab (so this is for TWO appointments - please see the scheduling desk as you leave)  Due to the ongoing Covid 19 pandemic, our lab now requires an appointment for any labs done at our office.  If you need labs done and do not have an appointment, please call our office ahead of time to schedule before presenting to the lab for your testing.

## 2020-01-16 ENCOUNTER — Encounter: Payer: Self-pay | Admitting: Internal Medicine

## 2020-01-16 DIAGNOSIS — E538 Deficiency of other specified B group vitamins: Secondary | ICD-10-CM | POA: Insufficient documentation

## 2020-01-16 NOTE — Assessment & Plan Note (Signed)
stable overall by history and exam, recent data reviewed with pt, and pt to continue medical treatment as before,  to f/u any worsening symptoms or concerns  

## 2020-01-16 NOTE — Assessment & Plan Note (Signed)
Uncontrolled, pt encouraged to take statin daily

## 2020-01-16 NOTE — Assessment & Plan Note (Addendum)
Uncontrolled, to start oral replacement  I spent 31 minutes in addition to time for CPX wellness examination in preparing to see the patient by review of recent labs, imaging and procedures, obtaining and reviewing separately obtained history, communicating with the patient and family or caregiver, ordering medications, tests or procedures, and documenting clinical information in the EHR including the differential Dx, treatment, and any further evaluation and other management of b12 def, hld, hyperglycemia, gerd, fms

## 2020-01-16 NOTE — Assessment & Plan Note (Signed)

## 2020-01-20 ENCOUNTER — Other Ambulatory Visit: Payer: Self-pay | Admitting: Internal Medicine

## 2020-01-20 NOTE — Telephone Encounter (Signed)
Please refill as per office routine med refill policy (all routine meds refilled for 3 mo or monthly per pt preference up to one year from last visit, then month to month grace period for 3 mo, then further med refills will have to be denied)  

## 2020-01-22 ENCOUNTER — Other Ambulatory Visit: Payer: Self-pay | Admitting: Internal Medicine

## 2020-08-09 ENCOUNTER — Telehealth: Payer: Self-pay | Admitting: Internal Medicine

## 2020-08-09 NOTE — Telephone Encounter (Signed)
Will call back to schedule AWV with [facility]. Please schedule if pt calls the office.

## 2020-10-27 ENCOUNTER — Other Ambulatory Visit: Payer: Self-pay | Admitting: Internal Medicine

## 2020-10-27 NOTE — Telephone Encounter (Signed)
Please refill as per office routine med refill policy (all routine meds to be refilled for 3 mo or monthly (per pt preference) up to one year from last visit, then month to month grace period for 3 mo, then further med refills will have to be denied) ? ?

## 2020-12-05 ENCOUNTER — Other Ambulatory Visit: Payer: Self-pay

## 2020-12-05 ENCOUNTER — Ambulatory Visit (INDEPENDENT_AMBULATORY_CARE_PROVIDER_SITE_OTHER): Payer: Medicare Other

## 2020-12-05 DIAGNOSIS — Z23 Encounter for immunization: Secondary | ICD-10-CM | POA: Diagnosis not present

## 2021-07-25 IMAGING — MR MR PROSTATE WO/W CM
56 series · 56 of 56 positions shown · IV contrast (14ml multihance)
Comparison: CT urogram from 1061

CLINICAL DATA: Elevated PSA negative biopsy x2, latest PSA of

EXAM:
MR PROSTATE WITHOUT AND WITH CONTRAST
TECHNIQUE: Multiplanar multisequence MRI images were obtained of the pelvis
centered about the prostate. Pre and post contrast images were
obtained.
CONTRAST:  14mL MULTIHANCE GADOBENATE DIMEGLUMINE 529 MG/ML IV SOLN

[Series 3: bSSFP fat-sat · axial · 8.0mm · 0.74mm/px · 1 of 28 slices shown]
[im 1/28]
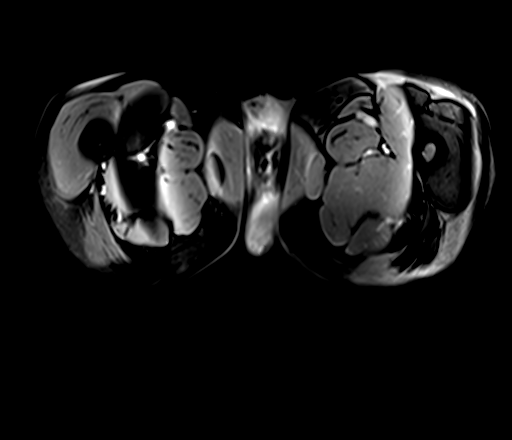

[Series 4: T1 · axial · 5.0mm · 1.25mm/px · 1 of 80 slices shown]
[im 1/80]
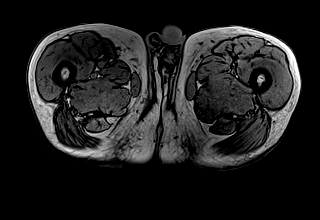

[Series 5: T2 · coronal · 3.5mm · 0.56mm/px · 1 of 23 slices shown (1 of 3)]
[im 1/23]
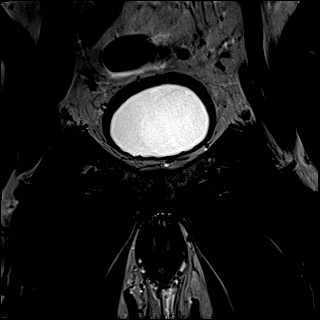

[Series 6: DWI · axial · 3.5mm · 1.56mm/px · 1 of 60 slices shown (1 of 6)]
[im 1/60]
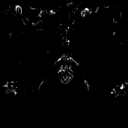

[Series 7: DWI · axial · 3.5mm · 1.56mm/px · 1 of 20 slices shown (2 of 6)]
[im 1/20]
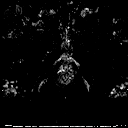

[Series 8: DWI · axial · 3.5mm · 1.75mm/px · 1 of 60 slices shown (3 of 6)]
[im 1/60]
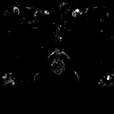

[Series 9: DWI · axial · 3.5mm · 1.75mm/px · 1 of 20 slices shown (4 of 6)]
[im 1/20]
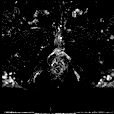

[Series 11: DWI · axial · 3.5mm · 1.56mm/px · 1 of 20 slices shown (5 of 6)]
[im 1/20]
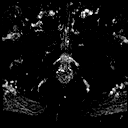

[Series 12: DWI · axial · 3.5mm · 1.56mm/px · 1 of 20 slices shown (6 of 6)]
[im 1/20]
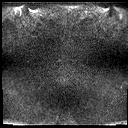

[Series 13: T2 · axial · 3.5mm · 0.56mm/px · 1 of 23 slices shown (2 of 3)]
[im 1/23]
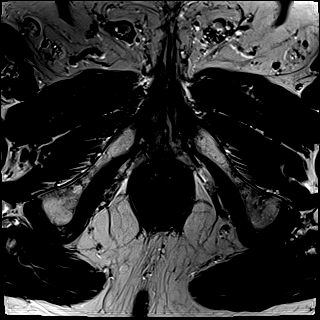

[Series 14: T2 · axial · 1.0mm · 1.04mm/px · 1 of 80 slices shown (3 of 3)]
[im 1/80]
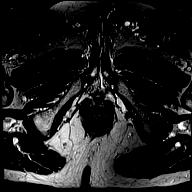

[Series 15: pre t1_twist_tra_dyn_ttc=5.3s · axial · non-contrast · 3.5mm · 0.83mm/px · 1 of 20 slices shown]
[im 1/20]
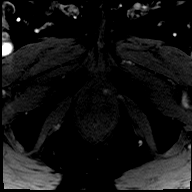

[Series 16: post t1_twist_tra_dyn-copy center · axial · 3.5mm · 0.83mm/px · 1 of 20 slices shown (1 of 23)]
[im 1/20]
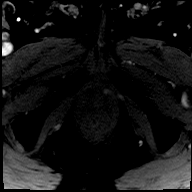

[Series 17: post t1_twist_tra_dyn-copy center · axial · 3.5mm · 0.83mm/px · 1 of 20 slices shown (2 of 23)]
[im 1/20]
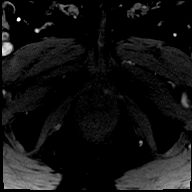

[Series 18: post t1_twist_tra_dyn-copy cent_sub_ttc=(id) · axial · 3.5mm · 0.83mm/px · 1 of 20 slices shown (1 of 21)]
[im 1/20]
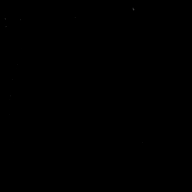

[Series 19: post t1_twist_tra_dyn-copy center · axial · 3.5mm · 0.83mm/px · 1 of 20 slices shown (3 of 23)]
[im 1/20]
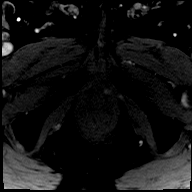

[Series 20: post t1_twist_tra_dyn-copy cent_sub_ttc=(id) · axial · 3.5mm · 0.83mm/px · 1 of 20 slices shown (2 of 21)]
[im 1/20]
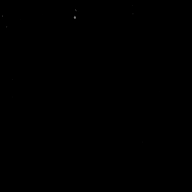

[Series 21: post t1_twist_tra_dyn-copy center · axial · 3.5mm · 0.83mm/px · 1 of 20 slices shown (4 of 23)]
[im 1/20]
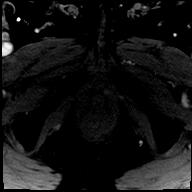

[Series 22: post t1_twist_tra_dyn-copy cent_sub_ttc=(id) · axial · 3.5mm · 0.83mm/px · 1 of 20 slices shown (3 of 21)]
[im 1/20]
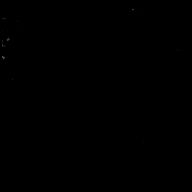

[Series 23: post t1_twist_tra_dyn-copy center · axial · 3.5mm · 0.83mm/px · 1 of 20 slices shown (5 of 23)]
[im 1/20]
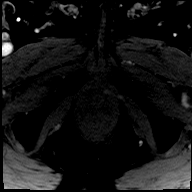

[Series 24: post t1_twist_tra_dyn-copy cent_sub_ttc=(id) · axial · 3.5mm · 0.83mm/px · 1 of 20 slices shown (4 of 21)]
[im 1/20]
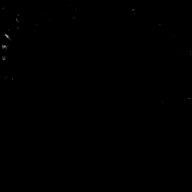

[Series 25: post t1_twist_tra_dyn-copy center · axial · 3.5mm · 0.83mm/px · 1 of 20 slices shown (6 of 23)]
[im 1/20]
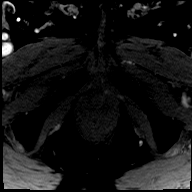

[Series 26: post t1_twist_tra_dyn-copy cent_sub_ttc=(id) · axial · 3.5mm · 0.83mm/px · 1 of 20 slices shown (5 of 21)]
[im 1/20]
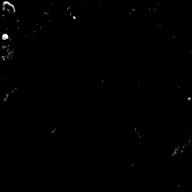

[Series 27: post t1_twist_tra_dyn-copy center · axial · 3.5mm · 0.83mm/px · 1 of 20 slices shown (7 of 23)]
[im 1/20]
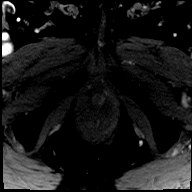

[Series 28: post t1_twist_tra_dyn-copy cent_sub_ttc=(id) · axial · 3.5mm · 0.83mm/px · 1 of 20 slices shown (6 of 21)]
[im 1/20]
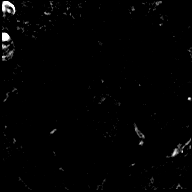

[Series 29: post t1_twist_tra_dyn-copy center · axial · 3.5mm · 0.83mm/px · 1 of 20 slices shown (8 of 23)]
[im 1/20]
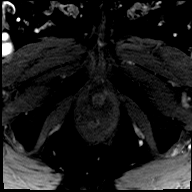

[Series 30: post t1_twist_tra_dyn-copy cent_sub_ttc=(id) · axial · 3.5mm · 0.83mm/px · 1 of 20 slices shown (7 of 21)]
[im 1/20]
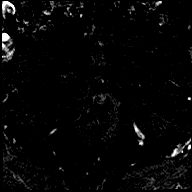

[Series 31: post t1_twist_tra_dyn-copy center · axial · 3.5mm · 0.83mm/px · 1 of 20 slices shown (9 of 23)]
[im 1/20]
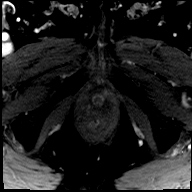

[Series 32: post t1_twist_tra_dyn-copy cent_sub_ttc=(id) · axial · 3.5mm · 0.83mm/px · 1 of 20 slices shown (8 of 21)]
[im 1/20]
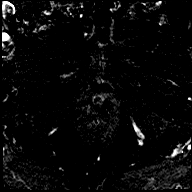

[Series 33: post t1_twist_tra_dyn-copy center · axial · 3.5mm · 0.83mm/px · 1 of 20 slices shown (10 of 23)]
[im 1/20]
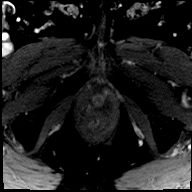

[Series 34: post t1_twist_tra_dyn-copy cent_sub_ttc=(id) · axial · 3.5mm · 0.83mm/px · 1 of 20 slices shown (9 of 21)]
[im 1/20]
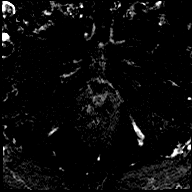

[Series 35: post t1_twist_tra_dyn-copy center · axial · 3.5mm · 0.83mm/px · 1 of 20 slices shown (11 of 23)]
[im 1/20]
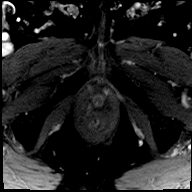

[Series 36: post t1_twist_tra_dyn-copy cent_sub_ttc=(id) · axial · 3.5mm · 0.83mm/px · 1 of 20 slices shown (10 of 21)]
[im 1/20]
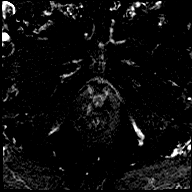

[Series 37: post t1_twist_tra_dyn-copy center · axial · 3.5mm · 0.83mm/px · 1 of 20 slices shown (12 of 23)]
[im 1/20]
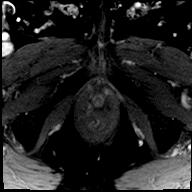

[Series 38: post t1_twist_tra_dyn-copy cent_sub_ttc=(id) · axial · 3.5mm · 0.83mm/px · 1 of 20 slices shown (11 of 21)]
[im 1/20]
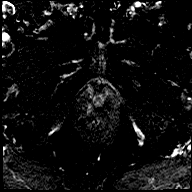

[Series 39: post t1_twist_tra_dyn-copy center · axial · 3.5mm · 0.83mm/px · 1 of 20 slices shown (13 of 23)]
[im 1/20]
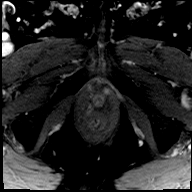

[Series 40: post t1_twist_tra_dyn-copy cent_sub_ttc=(id) · axial · 3.5mm · 0.83mm/px · 1 of 20 slices shown (12 of 21)]
[im 1/20]
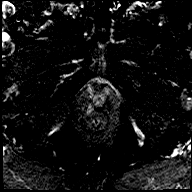

[Series 41: post t1_twist_tra_dyn-copy center · axial · 3.5mm · 0.83mm/px · 1 of 20 slices shown (14 of 23)]
[im 1/20]
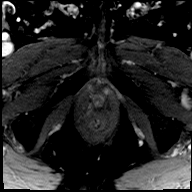

[Series 42: post t1_twist_tra_dyn-copy cent_sub_ttc=(id) · axial · 3.5mm · 0.83mm/px · 1 of 20 slices shown (13 of 21)]
[im 1/20]
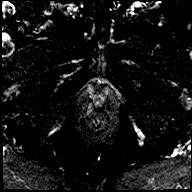

[Series 43: post t1_twist_tra_dyn-copy center · axial · 3.5mm · 0.83mm/px · 1 of 20 slices shown (15 of 23)]
[im 1/20]
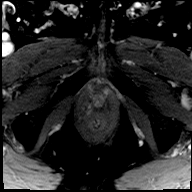

[Series 44: post t1_twist_tra_dyn-copy cent_sub_ttc=(id) · axial · 3.5mm · 0.83mm/px · 1 of 20 slices shown (14 of 21)]
[im 1/20]
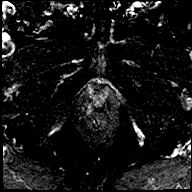

[Series 45: post t1_twist_tra_dyn-copy center · axial · 3.5mm · 0.83mm/px · 1 of 20 slices shown (16 of 23)]
[im 1/20]
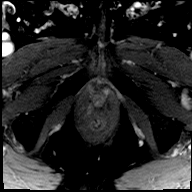

[Series 46: post t1_twist_tra_dyn-copy cent_sub_ttc=(id) · axial · 3.5mm · 0.83mm/px · 1 of 20 slices shown (15 of 21)]
[im 1/20]
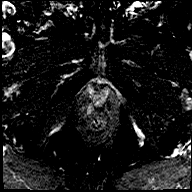

[Series 47: post t1_twist_tra_dyn-copy center · axial · 3.5mm · 0.83mm/px · 1 of 20 slices shown (17 of 23)]
[im 1/20]
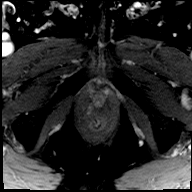

[Series 48: post t1_twist_tra_dyn-copy cent_sub_ttc=(id) · axial · 3.5mm · 0.83mm/px · 1 of 20 slices shown (16 of 21)]
[im 1/20]
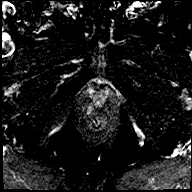

[Series 49: post t1_twist_tra_dyn-copy center · axial · 3.5mm · 0.83mm/px · 1 of 20 slices shown (18 of 23)]
[im 1/20]
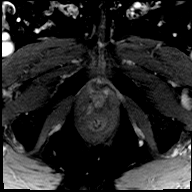

[Series 50: post t1_twist_tra_dyn-copy cent_sub_ttc=(id) · axial · 3.5mm · 0.83mm/px · 1 of 20 slices shown (17 of 21)]
[im 1/20]
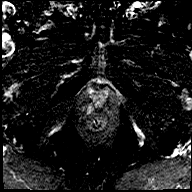

[Series 51: post t1_twist_tra_dyn-copy center · axial · 3.5mm · 0.83mm/px · 1 of 20 slices shown (19 of 23)]
[im 1/20]
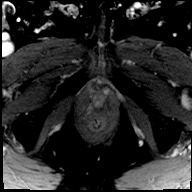

[Series 52: post t1_twist_tra_dyn-copy cent_sub_ttc=(id) · axial · 3.5mm · 0.83mm/px · 1 of 20 slices shown (18 of 21)]
[im 1/20]
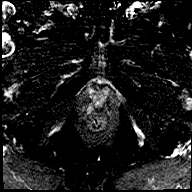

[Series 53: post t1_twist_tra_dyn-copy center · axial · 3.5mm · 0.83mm/px · 1 of 20 slices shown (20 of 23)]
[im 1/20]
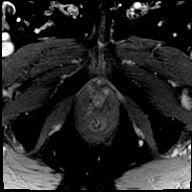

[Series 54: post t1_twist_tra_dyn-copy cent_sub_ttc=(id) · axial · 3.5mm · 0.83mm/px · 1 of 20 slices shown (19 of 21)]
[im 1/20]
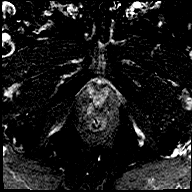

[Series 55: post t1_twist_tra_dyn-copy center · axial · 3.5mm · 0.83mm/px · 1 of 20 slices shown (21 of 23)]
[im 1/20]
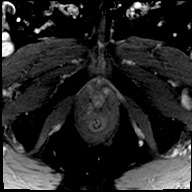

[Series 56: post t1_twist_tra_dyn-copy cent_sub_ttc=(id) · axial · 3.5mm · 0.83mm/px · 1 of 20 slices shown (20 of 21)]
[im 1/20]
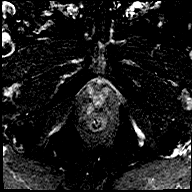

[Series 57: post t1_twist_tra_dyn-copy center · axial · 3.5mm · 0.83mm/px · 1 of 20 slices shown (22 of 23)]
[im 1/20]
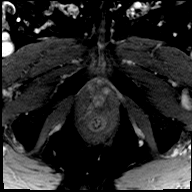

[Series 58: post t1_twist_tra_dyn-copy cent_sub_ttc=(id) · axial · 3.5mm · 0.83mm/px · 1 of 20 slices shown (21 of 21)]
[im 1/20]
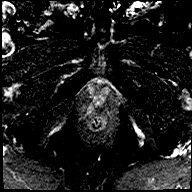

[Series 59: post t1_twist_tra_dyn-copy center · axial · 3.5mm · 0.83mm/px · 1 of 20 slices shown (23 of 23)]
[im 1/20]
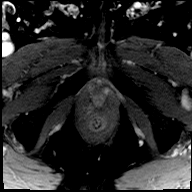

[56 of 56 positions shown; findings below may reference images not displayed]

FINDINGS: Prostate:

Peripheral zone: Wedge-shaped areas in linear areas of T2
hypointensity without focal masslike characteristics, no signs of
restricted diffusion in the peripheral zone.

Transitional zone: Changes of BPH. Focal area of hypointensity on
the ADC. This is at the left base at approximately the 5 to 6
o'clock position along the upper margin of the ?surgical? capsule.
Faint signal is seen on the high B value diffusion-weighted images.
PIRADS category 3 this is contained within a well-circumscribed BPH
nodule in shows T2 hypointensity less than that of surrounding BPH
nodules.

Second area of T2 hypointensity with some heterogeneity showing
moderate low signal on ADC in the right mid gland transitional zone.
At or just above background signal on high B value diffusion (image
13, series 9) this measures approximately 2 cm. PIRADS category 3

Volume: 105 cc

PSA density:

Transcapsular spread:  Absent

Seminal vesicle involvement: Absent

Neurovascular bundle involvement: Absent

Pelvic adenopathy: Absent

Bone metastasis: Densely sclerotic focus suggested in the left iliac
wing along the left sacroiliac joint. Similar appearance of both in
and out of phase T1 weighted gradient echo sequences except with
increased susceptibility noted on the inphase portion. Subtle area
of hypointensity on both in and out of phase slightly less than
muscle in the right iliac bone as well measuring 8 mm.

Other findings: Bladder calculus at 8 mm and another small bladder
calculus near the posterior aspect of the urinary bladder. Signs of
colonic diverticulosis.
IMPRESSION: 1. PIRADS category 3 lesions in the left transitional zone at the
base and the right transitional zone at the mid to apical prostate.
2. Changes of BPH in prostatitis.
3. Bladder calculi.
4. Indeterminate skeletal lesions in the bilateral iliac wings with
suggestion of sclerosis. One of these particularly on the left could
represent a bone island though was not seen on a more remote CT
evaluation. CT and/or bone scan corre[REDACTED] be helpful.

## 2021-12-04 ENCOUNTER — Other Ambulatory Visit: Payer: Self-pay | Admitting: Internal Medicine

## 2021-12-04 ENCOUNTER — Other Ambulatory Visit: Payer: Self-pay

## 2021-12-04 ENCOUNTER — Other Ambulatory Visit (INDEPENDENT_AMBULATORY_CARE_PROVIDER_SITE_OTHER): Payer: Medicare Other

## 2021-12-04 DIAGNOSIS — E538 Deficiency of other specified B group vitamins: Secondary | ICD-10-CM

## 2021-12-04 DIAGNOSIS — Z0001 Encounter for general adult medical examination with abnormal findings: Secondary | ICD-10-CM

## 2021-12-04 DIAGNOSIS — E7849 Other hyperlipidemia: Secondary | ICD-10-CM

## 2021-12-04 DIAGNOSIS — E559 Vitamin D deficiency, unspecified: Secondary | ICD-10-CM

## 2021-12-04 DIAGNOSIS — R739 Hyperglycemia, unspecified: Secondary | ICD-10-CM | POA: Diagnosis not present

## 2021-12-04 LAB — URINALYSIS, ROUTINE W REFLEX MICROSCOPIC
Bilirubin Urine: NEGATIVE
Hgb urine dipstick: NEGATIVE
Ketones, ur: NEGATIVE
Leukocytes,Ua: NEGATIVE
Nitrite: NEGATIVE
RBC / HPF: NONE SEEN (ref 0–?)
Specific Gravity, Urine: 1.01 (ref 1.000–1.030)
Total Protein, Urine: NEGATIVE
Urine Glucose: NEGATIVE
Urobilinogen, UA: 0.2 (ref 0.0–1.0)
WBC, UA: NONE SEEN (ref 0–?)
pH: 6 (ref 5.0–8.0)

## 2021-12-04 LAB — BASIC METABOLIC PANEL
BUN: 15 mg/dL (ref 6–23)
CO2: 24 mEq/L (ref 19–32)
Calcium: 9.1 mg/dL (ref 8.4–10.5)
Chloride: 101 mEq/L (ref 96–112)
Creatinine, Ser: 0.94 mg/dL (ref 0.40–1.50)
GFR: 79.55 mL/min (ref >60.00)
Glucose, Bld: 130 mg/dL — ABNORMAL HIGH (ref 70–99)
Potassium: 4 mEq/L (ref 3.5–5.1)
Sodium: 133 mEq/L — ABNORMAL LOW (ref 135–145)

## 2021-12-04 LAB — CBC WITH DIFFERENTIAL/PLATELET
Basophils Absolute: 0.1 10*3/uL (ref 0.0–0.1)
Basophils Relative: 1.1 % (ref 0.0–3.0)
Eosinophils Absolute: 0.5 10*3/uL (ref 0.0–0.7)
Eosinophils Relative: 6.9 % — ABNORMAL HIGH (ref 0.0–5.0)
HCT: 43.4 % (ref 39.0–52.0)
Hemoglobin: 14.7 g/dL (ref 13.0–17.0)
Lymphocytes Relative: 25.3 % (ref 12.0–46.0)
Lymphs Abs: 1.7 10*3/uL (ref 0.7–4.0)
MCHC: 33.8 g/dL (ref 30.0–36.0)
MCV: 92.7 fl (ref 78.0–100.0)
Monocytes Absolute: 0.5 10*3/uL (ref 0.1–1.0)
Monocytes Relative: 8 % (ref 3.0–12.0)
Neutro Abs: 4 10*3/uL (ref 1.4–7.7)
Neutrophils Relative %: 58.7 % (ref 43.0–77.0)
Platelets: 257 10*3/uL (ref 150.0–400.0)
RBC: 4.68 Mil/uL (ref 4.22–5.81)
RDW: 12.9 % (ref 11.5–15.5)
WBC: 6.7 10*3/uL (ref 4.0–10.5)

## 2021-12-04 LAB — LIPID PANEL
Cholesterol: 193 mg/dL (ref 0–200)
HDL: 36.7 mg/dL — ABNORMAL LOW (ref >39.00)
LDL Cholesterol: 142 mg/dL — ABNORMAL HIGH (ref 0–99)
NonHDL: 156.15
Total CHOL/HDL Ratio: 5
Triglycerides: 70 mg/dL (ref 0.0–149.0)
VLDL: 14 mg/dL (ref 0.0–40.0)

## 2021-12-04 LAB — PSA: PSA: 14.58 ng/mL — ABNORMAL HIGH (ref 0.10–4.00)

## 2021-12-04 LAB — VITAMIN D 25 HYDROXY (VIT D DEFICIENCY, FRACTURES): VITD: 28.65 ng/mL — ABNORMAL LOW (ref 30.00–100.00)

## 2021-12-04 LAB — HEPATIC FUNCTION PANEL
ALT: 10 U/L (ref 0–53)
AST: 15 U/L (ref 0–37)
Albumin: 4.1 g/dL (ref 3.5–5.2)
Alkaline Phosphatase: 68 U/L (ref 39–117)
Bilirubin, Direct: 0.1 mg/dL (ref 0.0–0.3)
Total Bilirubin: 0.7 mg/dL (ref 0.2–1.2)
Total Protein: 7 g/dL (ref 6.0–8.3)

## 2021-12-04 LAB — VITAMIN B12: Vitamin B-12: 267 pg/mL (ref 211–911)

## 2021-12-04 LAB — HEMOGLOBIN A1C: Hgb A1c MFr Bld: 6.7 % — ABNORMAL HIGH (ref 4.6–6.5)

## 2021-12-04 LAB — TSH: TSH: 1.35 u[IU]/mL (ref 0.35–5.50)

## 2021-12-08 ENCOUNTER — Encounter: Payer: Self-pay | Admitting: Internal Medicine

## 2021-12-08 ENCOUNTER — Ambulatory Visit (INDEPENDENT_AMBULATORY_CARE_PROVIDER_SITE_OTHER): Payer: Medicare Other | Admitting: Internal Medicine

## 2021-12-08 VITALS — BP 122/84 | HR 82 | Temp 98.3°F | Ht 65.5 in | Wt 154.0 lb

## 2021-12-08 DIAGNOSIS — Z0001 Encounter for general adult medical examination with abnormal findings: Secondary | ICD-10-CM | POA: Diagnosis not present

## 2021-12-08 DIAGNOSIS — E538 Deficiency of other specified B group vitamins: Secondary | ICD-10-CM | POA: Diagnosis not present

## 2021-12-08 DIAGNOSIS — G720 Drug-induced myopathy: Secondary | ICD-10-CM

## 2021-12-08 DIAGNOSIS — R739 Hyperglycemia, unspecified: Secondary | ICD-10-CM

## 2021-12-08 DIAGNOSIS — E559 Vitamin D deficiency, unspecified: Secondary | ICD-10-CM

## 2021-12-08 DIAGNOSIS — N400 Enlarged prostate without lower urinary tract symptoms: Secondary | ICD-10-CM

## 2021-12-08 DIAGNOSIS — Z125 Encounter for screening for malignant neoplasm of prostate: Secondary | ICD-10-CM | POA: Diagnosis not present

## 2021-12-08 DIAGNOSIS — E7849 Other hyperlipidemia: Secondary | ICD-10-CM | POA: Diagnosis not present

## 2021-12-08 DIAGNOSIS — Z1211 Encounter for screening for malignant neoplasm of colon: Secondary | ICD-10-CM

## 2021-12-08 MED ORDER — TAMSULOSIN HCL 0.4 MG PO CAPS: 0.4000 mg | ORAL_CAPSULE | Freq: Every day | ORAL | 3 refills | Status: DC

## 2021-12-08 NOTE — Patient Instructions (Addendum)
You will be contacted regarding the referral for: colonoscopy  Please take all new medication as prescribed- -the flomax at bedtime  Please continue all other medications as before, and refills have been done if requested.  Please have the pharmacy call with any other refills you may need.  Please continue your efforts at being more active, low cholesterol diet, and weight control.  You are otherwise up to date with prevention measures today.  Please keep your appointments with your specialists as you may have planned  Please go to the LAB at the blood drawing area for the tests to be done  Please make an Appointment to return for your 1 year visit, or sooner if needed, with Lab testing by Appointment as well, to be done about 3-5 days before at the [facility] (so this is for TWO appointments - please see the scheduling desk as you leave)

## 2021-12-08 NOTE — Progress Notes (Unsigned)
Patient ID: [name] [name], male   DOB: 06-24-1945, 76 y.o.   MRN: [id number]         Chief Complaint:: wellness exam and hld, statin myopathy, prostatism, low vit d, hyperglycemia       HPI:  [name] [name] is a 76 y.o. male here for wellness exam; for colonoscopy, declines tdap, flu shot, covid booster, o/w up to date                        Also has some urinary very slow and hesitant when has to get up to BR at night, weak stream, but Denies urinary symptoms such as dysuria, frequency, urgency, flank pain, hematuria or n/v, fever, chills.  Has not been able to tolerate statin, declines repatha or nexlizet for now, wants to work on lower chol diet.  Pt denies chest pain, increased sob or doe, wheezing, orthopnea, PND, increased LE swelling, palpitations, dizziness or syncope.   Pt denies polydipsia, polyuria, or new focal neuro s/s.      Wt Readings from Last 3 Encounters:  12/08/21 154 lb (69.9 kg)  01/12/20 153 lb (69.4 kg)  01/09/19 155 lb (70.3 kg)   BP Readings from Last 3 Encounters:  12/08/21 122/84  01/12/20 120/80  01/09/19 132/86   Immunization History  Administered Date(s) Administered   Fluad Quad(high Dose 65+) 11/26/2018, 12/05/2020   Influenza, High Dose Seasonal PF 11/12/2012, 11/29/2016, 10/29/2017   PFIZER(Purple Top)SARS-COV-2 Vaccination 03/21/2019, 04/13/2019   Pneumococcal Conjugate-13 09/21/2016   Pneumococcal Polysaccharide-23 10/03/2017   Tdap 07/23/2011   Zoster Recombinat (Shingrix) 01/09/2019, 03/16/2019   Health Maintenance Due  Topic Date Due   Medicare Annual Wellness (AWV)  Never done      Past Medical History:  Diagnosis Date   History of kidney stones    Hyperlipidemia    Osteoarthritis    Right inguinal hernia    Umbilical hernia    Past Surgical History:  Procedure Laterality Date   EXTRACORPOREAL SHOCK WAVE LITHOTRIPSY Left 10/29/2016   Procedure: LEFT EXTRACORPOREAL SHOCK WAVE LITHOTRIPSY (ESWL);  Surgeon: Hildred Laser, MD;   Location: [facility];  Service: Urology;  Laterality: Left;   HERNIA REPAIR     [facility]   HERNIA REPAIR  12/06/10   umbilical hernia    INGUINAL HERNIA REPAIR Right 09/04/2018   Procedure: RIGHT INGUINAL HERNIA REPAIR WITH MESH;  Surgeon: Emelia Loron, MD;  Location: [facility];  Service: General;  Laterality: Right;   VASECTOMY      reports that he has never smoked. He has never used smokeless tobacco. He reports current alcohol use. He reports that he does not use drugs. family history is not on file. Allergies  Allergen Reactions   Crestor [Rosuvastatin] Other (See Comments)    myalgias   Lipitor [Atorvastatin] Other (See Comments)    "feels funny inside"   [name] current outpatient medications on file prior to visit.   [name] current facility-administered medications on file prior to visit.        ROS:  All others reviewed and negative.  Objective        PE:  BP 122/84   Pulse 82   Temp 98.3 F (36.8 C)   Ht 5' 5.5" (1.664 m)   Wt 154 lb (69.9 kg)   SpO2 97%   BMI 25.24 kg/m                 Constitutional: Pt appears in NAD  HENT: Head: NCAT.                Right Ear: External ear normal.                 Left Ear: External ear normal.                Eyes: . Pupils are equal, round, and reactive to light. Conjunctivae and EOM are normal               Nose: without d/c or deformity               Neck: Neck supple. Gross normal ROM               Cardiovascular: Normal rate and regular rhythm.                 Pulmonary/Chest: Effort normal and breath sounds without rales or wheezing.                Abd:  Soft, NT, ND, + BS, [name] organomegaly               Neurological: Pt is alert. At baseline orientation, motor grossly intact               Skin: Skin is warm. [name] rashes, [name] other new lesions, LE edema - none               Psychiatric: Pt behavior is normal without agitation   Micro: none  Cardiac tracings I have personally interpreted today:   none  Pertinent Radiological findings (summarize): none   Lab Results  Component Value Date   WBC 6.7 12/04/2021   HGB 14.7 12/04/2021   HCT 43.4 12/04/2021   PLT 257.0 12/04/2021   GLUCOSE 130 (H) 12/04/2021   CHOL 193 12/04/2021   TRIG 70.0 12/04/2021   HDL 36.70 (L) 12/04/2021   LDLCALC 142 (H) 12/04/2021   ALT 10 12/04/2021   AST 15 12/04/2021   NA 133 (L) 12/04/2021   K 4.0 12/04/2021   CL 101 12/04/2021   CREATININE 0.94 12/04/2021   BUN 15 12/04/2021   CO2 24 12/04/2021   TSH 1.35 12/04/2021   PSA 14.58 (H) 12/04/2021   HGBA1C 6.7 (H) 12/04/2021   Assessment/Plan:  [name] [name] is a 76 y.o. White or Caucasian [1] male with  has a past medical history of History of kidney stones, Hyperlipidemia, Osteoarthritis, Right inguinal hernia, and Umbilical hernia.  Encounter for well adult exam with abnormal findings Age and sex appropriate education and counseling updated with regular exercise and diet Referrals for preventative services - for colonoscopy Immunizations addressed - decliens tdap, flu shot, covid booster Smoking counseling  - none needed Evidence for depression or other mood disorder - none significant Most recent labs reviewed. I have personally reviewed and have noted: 1) the patient's medical and social history 2) The patient's current medications and supplements 3) The patient's height, weight, and BMI have been recorded in the chart   Vitamin D deficiency Last vitamin D Lab Results  Component Value Date   VD25OH 28.65 (L) 12/04/2021   Low, to start oral replacement   Prostatism Mild night time symtpoms, ok for flomax 0.4 mg qhs trial,  to f/u any worsening symptoms or concerns  Hyperlipidemia Lab Results  Component Value Date   LDLCALC 142 (H) 12/04/2021   Severe uncontrolled, has not tolerated statins well, declines repatha or nexlizet for now, continue with lower  cholesterol diet  Hyperglycemia Lab Results  Component Value Date    HGBA1C 6.7 (H) 12/04/2021   Mild uncontrolled, declines metformin or other OHA for now,  to f/u any worsening symptoms or concerns  Drug-induced myopathy Statin intolerant, cont low chol diet  Followup: Return in about 1 year (around 12/09/2022).  Oliver Barre, MD 12/10/2021 1:14 PM [facility]

## 2021-12-10 ENCOUNTER — Encounter: Payer: Self-pay | Admitting: Internal Medicine

## 2021-12-10 NOTE — Assessment & Plan Note (Signed)
Last vitamin D Lab Results  Component Value Date   VD25OH 28.65 (L) 12/04/2021   Low, to start oral replacement

## 2021-12-10 NOTE — Assessment & Plan Note (Signed)
Lab Results  Component Value Date   HGBA1C 6.7 (H) 12/04/2021   Mild uncontrolled, declines metformin or other OHA for now,  to f/u any worsening symptoms or concerns

## 2021-12-10 NOTE — Assessment & Plan Note (Signed)
Statin intolerant, cont low chol diet

## 2021-12-10 NOTE — Assessment & Plan Note (Signed)
Age and sex appropriate education and counseling updated with regular exercise and diet Referrals for preventative services - for colonoscopy Immunizations addressed - decliens tdap, flu shot, covid booster Smoking counseling  - none needed Evidence for depression or other mood disorder - none significant Most recent labs reviewed. I have personally reviewed and have noted: 1) the patient's medical and social history 2) The patient's current medications and supplements 3) The patient's height, weight, and BMI have been recorded in the chart

## 2021-12-10 NOTE — Assessment & Plan Note (Signed)
Lab Results  Component Value Date   LDLCALC 142 (H) 12/04/2021   Severe uncontrolled, has not tolerated statins well, declines repatha or nexlizet for now, continue with lower cholesterol diet

## 2021-12-10 NOTE — Assessment & Plan Note (Signed)
Mild night time symtpoms, ok for flomax 0.4 mg qhs trial,  to f/u any worsening symptoms or concerns

## 2022-02-09 ENCOUNTER — Ambulatory Visit (INDEPENDENT_AMBULATORY_CARE_PROVIDER_SITE_OTHER): Payer: Medicare Other

## 2022-02-09 VITALS — Ht 65.5 in | Wt 150.0 lb

## 2022-02-09 DIAGNOSIS — Z0000 Encounter for general adult medical examination without abnormal findings: Secondary | ICD-10-CM | POA: Diagnosis not present

## 2022-02-09 NOTE — Patient Instructions (Signed)
[name] [name] , Thank you for taking time to come for your Medicare Wellness Visit. I appreciate your ongoing commitment to your health goals. Please review the following plan we discussed and let me know if I can assist you in the future.   These are the goals we discussed:  Goals      Client understands the importance of follow-up with providers by attending scheduled visits.     No healthcare goals at this time.  Just maintain my health, stay independent and active.        This is a list of the screening recommended for you and due dates:  Health Maintenance  Topic Date Due   DTaP/Tdap/Td vaccine (2 - Td or Tdap) 07/22/2021   COVID-19 Vaccine (3 - 2023-24 season) 10/06/2021   Flu Shot  05/06/2022*   Colon Cancer Screening  12/11/2022*   Medicare Annual Wellness Visit  02/10/2023   Pneumonia Vaccine  Completed   Hepatitis C Screening: USPSTF Recommendation to screen - Ages 18-79 yo.  Completed   Zoster (Shingles) Vaccine  Completed   HPV Vaccine  Aged Out  *Topic was postponed. The date shown is not the original due date.    Advanced directives: No  Conditions/risks identified: Yes  Next appointment: Follow up in one year for your annual wellness visit.   Preventive Care 61 Years and Older, Male  Preventive care refers to lifestyle choices and visits with your health care provider that can promote health and wellness. What does preventive care include? A yearly physical exam. This is also called an annual well check. Dental exams once or twice a year. Routine eye exams. Ask your health care provider how often you should have your eyes checked. Personal lifestyle choices, including: Daily care of your teeth and gums. Regular physical activity. Eating a healthy diet. Avoiding tobacco and drug use. Limiting alcohol use. Practicing safe sex. Taking low doses of aspirin every day. Taking vitamin and mineral supplements as recommended by your health care provider. What  happens during an annual well check? The services and screenings done by your health care provider during your annual well check will depend on your age, overall health, lifestyle risk factors, and family history of disease. Counseling  Your health care provider may ask you questions about your: Alcohol use. Tobacco use. Drug use. Emotional well-being. Home and relationship well-being. Sexual activity. Eating habits. History of falls. Memory and ability to understand (cognition). Work and work Statistician. Screening  You may have the following tests or measurements: Height, weight, and BMI. Blood pressure. Lipid and cholesterol levels. These may be checked every 5 years, or more frequently if you are over 68 years old. Skin check. Lung cancer screening. You may have this screening every year starting at age 66 if you have a 30-pack-year history of smoking and currently smoke or have quit within the past 15 years. Fecal occult blood test (FOBT) of the stool. You may have this test every year starting at age 63. Flexible sigmoidoscopy or colonoscopy. You may have a sigmoidoscopy every 5 years or a colonoscopy every 10 years starting at age 50. Prostate cancer screening. Recommendations will vary depending on your family history and other risks. Hepatitis C blood test. Hepatitis B blood test. Sexually transmitted disease (STD) testing. Diabetes screening. This is done by checking your blood sugar (glucose) after you have not eaten for a while (fasting). You may have this done every 1-3 years. Abdominal aortic aneurysm (AAA) screening. You may need this  if you are a current or former smoker. Osteoporosis. You may be screened starting at age 22 if you are at high risk. Talk with your health care provider about your test results, treatment options, and if necessary, the need for more tests. Vaccines  Your health care provider may recommend certain vaccines, such as: Influenza vaccine. This  is recommended every year. Tetanus, diphtheria, and acellular pertussis (Tdap, Td) vaccine. You may need a Td booster every 10 years. Zoster vaccine. You may need this after age 73. Pneumococcal 13-valent conjugate (PCV13) vaccine. One dose is recommended after age 58. Pneumococcal polysaccharide (PPSV23) vaccine. One dose is recommended after age 2. Talk to your health care provider about which screenings and vaccines you need and how often you need them. This information is not intended to replace advice given to you by your health care provider. Make sure you discuss any questions you have with your health care provider. Document Released: 02/18/2015 Document Revised: 10/12/2015 Document Reviewed: 11/23/2014 Elsevier Interactive Patient Education  2017 [place] Prevention in the Home Falls can cause injuries. They can happen to people of all ages. There are many things you can do to make your home safe and to help prevent falls. What can I do on the outside of my home? Regularly fix the edges of walkways and driveways and fix any cracks. Remove anything that might make you trip as you walk through a door, such as a raised step or threshold. Trim any bushes or trees on the path to your home. Use bright outdoor lighting. Clear any walking paths of anything that might make someone trip, such as rocks or tools. Regularly check to see if handrails are loose or broken. Make sure that both sides of any steps have handrails. Any raised decks and porches should have guardrails on the edges. Have any leaves, snow, or ice cleared regularly. Use sand or salt on walking paths during winter. Clean up any spills in your garage right away. This includes oil or grease spills. What can I do in the bathroom? Use night lights. Install grab bars by the toilet and in the tub and shower. Do not use towel bars as grab bars. Use non-skid mats or decals in the tub or shower. If you need to sit down  in the shower, use a plastic, non-slip stool. Keep the floor dry. Clean up any water that spills on the floor as soon as it happens. Remove soap buildup in the tub or shower regularly. Attach bath mats securely with double-sided non-slip rug tape. Do not have throw rugs and other things on the floor that can make you trip. What can I do in the bedroom? Use night lights. Make sure that you have a light by your bed that is easy to reach. Do not use any sheets or blankets that are too big for your bed. They should not hang down onto the floor. Have a firm chair that has side arms. You can use this for support while you get dressed. Do not have throw rugs and other things on the floor that can make you trip. What can I do in the kitchen? Clean up any spills right away. Avoid walking on wet floors. Keep items that you use a lot in easy-to-reach places. If you need to reach something above you, use a strong step stool that has a grab bar. Keep electrical cords out of the way. Do not use floor polish or wax that makes floors slippery.  If you must use wax, use non-skid floor wax. Do not have throw rugs and other things on the floor that can make you trip. What can I do with my stairs? Do not leave any items on the stairs. Make sure that there are handrails on both sides of the stairs and use them. Fix handrails that are broken or loose. Make sure that handrails are as long as the stairways. Check any carpeting to make sure that it is firmly attached to the stairs. Fix any carpet that is loose or worn. Avoid having throw rugs at the top or bottom of the stairs. If you do have throw rugs, attach them to the floor with carpet tape. Make sure that you have a light switch at the top of the stairs and the bottom of the stairs. If you do not have them, ask someone to add them for you. What else can I do to help prevent falls? Wear shoes that: Do not have high heels. Have rubber bottoms. Are comfortable  and fit you well. Are closed at the toe. Do not wear sandals. If you use a stepladder: Make sure that it is fully opened. Do not climb a closed stepladder. Make sure that both sides of the stepladder are locked into place. Ask someone to hold it for you, if possible. Clearly mark and make sure that you can see: Any grab bars or handrails. First and last steps. Where the edge of each step is. Use tools that help you move around (mobility aids) if they are needed. These include: Canes. Walkers. Scooters. Crutches. Turn on the lights when you go into a dark area. Replace any light bulbs as soon as they burn out. Set up your furniture so you have a clear path. Avoid moving your furniture around. If any of your floors are uneven, fix them. If there are any pets around you, be aware of where they are. Review your medicines with your doctor. Some medicines can make you feel dizzy. This can increase your chance of falling. Ask your doctor what other things that you can do to help prevent falls. This information is not intended to replace advice given to you by your health care provider. Make sure you discuss any questions you have with your health care provider. Document Released: 11/18/2008 Document Revised: 06/30/2015 Document Reviewed: 02/26/2014 Elsevier Interactive Patient Education  2017 [employer].

## 2022-02-09 NOTE — Progress Notes (Signed)
Virtual Visit via Telephone Note  I connected with  [name] [name] on 02/09/22 at  9:45 AM EST by telephone and verified that I am speaking with the correct person using two identifiers.  Location: Patient: Home Provider: [place] Persons participating in the virtual visit: [place]   I discussed the limitations, risks, security and privacy concerns of performing an evaluation and management service by telephone and the availability of in person appointments. The patient expressed understanding and agreed to proceed.  Interactive audio and video telecommunications were attempted between this nurse and patient, however failed, due to patient having technical difficulties OR patient did not have access to video capability.  We continued and completed visit with audio only.  Some vital signs may be absent or patient reported.   Sheral Flow, LPN  Subjective:   [name] [name] is a 77 y.o. male who presents for an Initial Medicare Annual Wellness Visit.  Review of Systems     Cardiac Risk Factors include: advanced age (>72men, >38 women);male gender     Objective:    Today's Vitals   02/09/22 0946  Weight: 150 lb (68 kg)  Height: 5' 5.5" (1.664 m)  PainSc: 0-No pain   Body mass index is 24.58 kg/m.     02/09/2022    9:49 AM 09/04/2018    8:11 AM 08/27/2018    3:39 PM 10/29/2016    7:02 AM  Advanced Directives  Does Patient Have a Medical Advance Directive? No No No No  Would patient like information on creating a medical advance directive? No - Patient declined No - Patient declined No - Patient declined     Current Medications (verified) Outpatient Encounter Medications as of 02/09/2022  Medication Sig   tamsulosin (FLOMAX) 0.4 MG CAPS capsule Take 1 capsule (0.4 mg total) by mouth at bedtime.   No facility-administered encounter medications on file as of 02/09/2022.    Allergies (verified) Crestor [rosuvastatin] and Lipitor  [atorvastatin]   History: Past Medical History:  Diagnosis Date   History of kidney stones    Hyperlipidemia    Osteoarthritis    Right inguinal hernia    Umbilical hernia    Past Surgical History:  Procedure Laterality Date   EXTRACORPOREAL SHOCK WAVE LITHOTRIPSY Left 10/29/2016   Procedure: LEFT EXTRACORPOREAL SHOCK WAVE LITHOTRIPSY (ESWL);  Surgeon: Nickie Retort, MD;  Location: [facility];  Service: Urology;  Laterality: Left;   HERNIA REPAIR     LIH   HERNIA REPAIR  78/29/56   umbilical hernia    INGUINAL HERNIA REPAIR Right 09/04/2018   Procedure: RIGHT INGUINAL HERNIA REPAIR WITH MESH;  Surgeon: Rolm Bookbinder, MD;  Location: [facility];  Service: General;  Laterality: Right;   VASECTOMY     No family history on file. Social History   Socioeconomic History   Marital status: Married    Spouse name: Not on file   Number of children: Not on file   Years of education: Not on file   Highest education level: Not on file  Occupational History   Not on file  Tobacco Use   Smoking status: Never   Smokeless tobacco: Never  Substance and Sexual Activity   Alcohol use: Yes    Comment: social   Drug use: No   Sexual activity: Yes  Other Topics Concern   Not on file  Social History Narrative   Not on file   Social Determinants of Health   Financial Resource Strain:  Low Risk  (02/09/2022)   Overall Financial Resource Strain (CARDIA)    Difficulty of Paying Living Expenses: Not hard at all  Food Insecurity: No Food Insecurity (02/09/2022)   Hunger Vital Sign    Worried About Running Out of Food in the Last Year: Never true    Ran Out of Food in the Last Year: Never true  Transportation Needs: No Transportation Needs (02/09/2022)   PRAPARE - Hydrologist (Medical): No    Lack of Transportation (Non-Medical): No  Physical Activity: Sufficiently Active (02/09/2022)   Exercise Vital Sign    Days of Exercise per Week: 5 days     Minutes of Exercise per Session: 30 min  Stress: No Stress Concern Present (02/09/2022)   [employer]    Feeling of Stress : Not at all  Social Connections: [place] (02/09/2022)   Social Connection and Isolation Panel [NHANES]    Frequency of Communication with Friends and Family: More than three times a week    Frequency of Social Gatherings with Friends and Family: More than three times a week    Attends Religious Services: More than 4 times per year    Active Member of [employer] or Organizations: Yes    Attends Music therapist: More than 4 times per year    Marital Status: Married    Tobacco Counseling Counseling given: Not Answered   Clinical Intake:  Pre-visit preparation completed: Yes  Pain : No/denies pain Pain Score: 0-No pain     BMI - recorded: 24.58 Nutritional Status: BMI of 19-24  Normal Nutritional Risks: None Diabetes: No  How often do you need to have someone help you when you read instructions, pamphlets, or other written materials from your doctor or pharmacy?: 1 - Never What is the last grade level you completed in school?: HSG; 2 Years of Technical School  Diabetic? No  Interpreter Needed?: No  Information entered by :: Lisette Abu, LPN.   Activities of Daily Living    02/09/2022    9:51 AM  In your present state of health, do you have any difficulty performing the following activities:  Hearing? 0  Vision? 0  Difficulty concentrating or making decisions? 0  Walking or climbing stairs? 0  Dressing or bathing? 0  Doing errands, shopping? 0  Preparing Food and eating ? N  Using the Toilet? N  In the past six months, have you accidently leaked urine? N  Do you have problems with loss of bowel control? N  Managing your Medications? N  Managing your Finances? N  Housekeeping or managing your Housekeeping? N    Patient Care Team: Biagio Borg, MD as PCP  - General (Internal Medicine) Laurence Aly, [place] as Consulting Physician (Optometry)  Indicate any recent Medical Services you may have received from other than [facility] providers in the past year (date may be approximate).     Assessment:   This is a routine wellness examination for [name] [name].  Hearing/Vision screen Hearing Screening - Comments:: Denies hearing difficulties   Vision Screening - Comments:: No glasses - up to date with routine eye exams with Dr. Laurence Aly with [place] issues and exercise activities discussed: Current Exercise Habits: Home exercise routine, Type of exercise: walking, Time (Minutes): 30, Frequency (Times/Week): 5, Weekly Exercise (Minutes/Week): 150, Intensity: Moderate, Exercise limited by: None identified   Goals Addressed  This Visit's Progress    Client understands the importance of follow-up with providers by attending scheduled visits.       No healthcare goals at this time.  Just maintain my health, stay independent and active.      Depression Screen    02/09/2022    9:55 AM 12/08/2021    2:39 PM 12/08/2021    2:24 PM 01/12/2020    1:52 PM 01/12/2020    1:18 PM 01/09/2019    3:06 PM 10/03/2017    1:00 PM  PHQ 2/9 Scores  PHQ - 2 Score 0 0 0 0 0 0 0  PHQ- 9 Score   0    0    Fall Risk    02/09/2022    9:51 AM 12/08/2021    2:39 PM 01/12/2020    1:52 PM 01/12/2020    1:18 PM 01/09/2019    3:06 PM  Fall Risk   Falls in the past year? 0 0 0 0 0  Number falls in past yr: 0 0  0   Injury with Fall? 0 0  0   Risk for fall due to : No Fall Risks   No Fall Risks   Follow up Falls prevention discussed   Falls evaluation completed     FALL RISK PREVENTION PERTAINING TO THE HOME:  Any stairs in or around the home? No  If so, are there any without handrails? No  Home free of loose throw rugs in walkways, pet beds, electrical cords, etc? Yes  Adequate lighting in your home to reduce risk of falls? Yes   ASSISTIVE DEVICES  UTILIZED TO PREVENT FALLS:  Life alert? No  Use of a cane, walker or w/c? No  Grab bars in the bathroom? Yes  Shower chair or bench in shower? No  Elevated toilet seat or a handicapped toilet? No   TIMED UP AND GO:  Was the test performed? No . Phone Visit  Cognitive Function:        02/09/2022   10:02 AM  6CIT Screen  What Year? 0 points  What month? 0 points  What time? 0 points  Count back from 20 0 points  Months in reverse 0 points  Repeat phrase 0 points  Total Score 0 points    Immunizations Immunization History  Administered Date(s) Administered   Fluad Quad(high Dose 65+) 11/26/2018, 12/05/2020   Influenza, High Dose Seasonal PF 11/12/2012, 11/29/2016, 10/29/2017   PFIZER(Purple Top)SARS-COV-2 Vaccination 03/21/2019, 04/13/2019   Pneumococcal Conjugate-13 09/21/2016   Pneumococcal Polysaccharide-23 10/03/2017   Tdap 07/23/2011   Zoster Recombinat (Shingrix) 01/09/2019, 03/16/2019    TDAP status: Due, Education has been provided regarding the importance of this vaccine. Advised may receive this vaccine at local pharmacy or Health Dept. Aware to provide a copy of the vaccination record if obtained from local pharmacy or Health Dept. Verbalized acceptance and understanding.  Flu Vaccine status: Due, Education has been provided regarding the importance of this vaccine. Advised may receive this vaccine at local pharmacy or Health Dept. Aware to provide a copy of the vaccination record if obtained from local pharmacy or Health Dept. Verbalized acceptance and understanding.  Pneumococcal vaccine status: Up to date  Covid-19 vaccine status: Completed vaccines  Qualifies for Shingles Vaccine? Yes   Zostavax completed No   Shingrix Completed?: Yes  Screening Tests Health Maintenance  Topic Date Due   DTaP/Tdap/Td (2 - Td or Tdap) 07/22/2021   COVID-19 Vaccine (3 - 2023-24 season) 10/06/2021  INFLUENZA VACCINE  05/06/2022 (Originally 09/05/2021)   COLONOSCOPY (Pts  45-72yrs Insurance coverage will need to be confirmed)  12/11/2022 (Originally 11/16/1991)   Medicare Annual Wellness (AWV)  02/10/2023   Pneumonia Vaccine 15+ Years old  Completed   Hepatitis C Screening  Completed   Zoster Vaccines- Shingrix  Completed   HPV VACCINES  Aged Out    Health Maintenance  Health Maintenance Due  Topic Date Due   DTaP/Tdap/Td (2 - Td or Tdap) 07/22/2021   COVID-19 Vaccine (3 - 2023-24 season) 10/06/2021    Colorectal Cancer screening: Postponed until 12/11/2022 by PCP  Lung Cancer Screening: (Low Dose CT Chest recommended if Age 89-80 years, 30 pack-year currently smoking OR have quit w/in 15years.) does not qualify.   Lung Cancer Screening Referral: no  Additional Screening:  Hepatitis C Screening: does qualify; Completed 06/17/2015  Vision Screening: Recommended annual ophthalmology exams for early detection of glaucoma and other disorders of the eye. Is the patient up to date with their annual eye exam?  Yes  Who is the provider or what is the name of the office in which the patient attends annual eye exams? Ander Purpura, OD. If pt is not established with a provider, would they like to be referred to a provider to establish care? No .   Dental Screening: Recommended annual dental exams for proper oral hygiene  Community Resource Referral / Chronic Care Management: CRR required this visit?  No   CCM required this visit?  No      Plan:     I have personally reviewed and noted the following in the patient's chart:   Medical and social history Use of alcohol, tobacco or illicit drugs  Current medications and supplements including opioid prescriptions. Patient is not currently taking opioid prescriptions. Functional ability and status Nutritional status Physical activity Advanced directives List of other physicians Hospitalizations, surgeries, and ER visits in previous 12 months Vitals Screenings to include cognitive, depression, and  falls Referrals and appointments  In addition, I have reviewed and discussed with patient certain preventive protocols, quality metrics, and best practice recommendations. A written personalized care plan for preventive services as well as general preventive health recommendations were provided to patient.     Mickeal Needy, LPN   05/14/7024   Nurse Notes: N/A

## 2022-03-06 DIAGNOSIS — C44311 Basal cell carcinoma of skin of nose: Secondary | ICD-10-CM | POA: Diagnosis not present

## 2022-03-06 DIAGNOSIS — L820 Inflamed seborrheic keratosis: Secondary | ICD-10-CM | POA: Diagnosis not present

## 2022-03-06 DIAGNOSIS — L538 Other specified erythematous conditions: Secondary | ICD-10-CM | POA: Diagnosis not present

## 2022-03-06 DIAGNOSIS — D485 Neoplasm of uncertain behavior of skin: Secondary | ICD-10-CM | POA: Diagnosis not present

## 2022-03-06 DIAGNOSIS — L298 Other pruritus: Secondary | ICD-10-CM | POA: Diagnosis not present

## 2022-03-06 DIAGNOSIS — L218 Other seborrheic dermatitis: Secondary | ICD-10-CM | POA: Diagnosis not present

## 2022-03-06 DIAGNOSIS — Z789 Other specified health status: Secondary | ICD-10-CM | POA: Diagnosis not present

## 2022-03-12 ENCOUNTER — Telehealth: Payer: Self-pay | Admitting: Internal Medicine

## 2022-03-12 NOTE — Telephone Encounter (Signed)
Please have pt check his jury duty notice, as he may not need a note due to age

## 2022-03-12 NOTE — Telephone Encounter (Signed)
Notified pt w/MD response.../lmb 

## 2022-03-12 NOTE — Telephone Encounter (Signed)
Pt need a note for jury duty. Pt states he is unable to seat for long period of time due to having to use the rest restroom frequently.

## 2022-04-19 DIAGNOSIS — C44311 Basal cell carcinoma of skin of nose: Secondary | ICD-10-CM | POA: Diagnosis not present

## 2022-06-05 NOTE — Telephone Encounter (Signed)
error 

## 2022-08-20 ENCOUNTER — Other Ambulatory Visit (HOSPITAL_COMMUNITY): Payer: Self-pay

## 2022-08-20 ENCOUNTER — Ambulatory Visit: Payer: Medicare Other | Admitting: Internal Medicine

## 2022-08-20 VITALS — BP 130/80 | HR 77 | Temp 98.3°F | Ht 65.5 in | Wt 156.2 lb

## 2022-08-20 DIAGNOSIS — J3489 Other specified disorders of nose and nasal sinuses: Secondary | ICD-10-CM | POA: Diagnosis not present

## 2022-08-20 DIAGNOSIS — R739 Hyperglycemia, unspecified: Secondary | ICD-10-CM

## 2022-08-20 DIAGNOSIS — E559 Vitamin D deficiency, unspecified: Secondary | ICD-10-CM

## 2022-08-20 DIAGNOSIS — J019 Acute sinusitis, unspecified: Secondary | ICD-10-CM

## 2022-08-20 DIAGNOSIS — J309 Allergic rhinitis, unspecified: Secondary | ICD-10-CM

## 2022-08-20 LAB — POC COVID19 BINAXNOW: SARS Coronavirus 2 Ag: NEGATIVE

## 2022-08-20 MED ORDER — AZITHROMYCIN 250 MG PO TABS
ORAL_TABLET | ORAL | 1 refills | Status: AC
  Filled 2022-08-20: qty 6, 5d supply, fill #0

## 2022-08-20 MED ORDER — PREDNISONE 10 MG PO TABS
ORAL_TABLET | ORAL | 0 refills | Status: DC
  Filled 2022-08-20: qty 18, 9d supply, fill #0

## 2022-08-20 NOTE — Progress Notes (Unsigned)
Patient ID: Fontaine No, male   DOB: 09/28/1945, 77 y.o.   MRN: [id number]        Chief Complaint: follow up sinusitis, allergies, hyperglycemia, low vit d       HPI:  [name] [name] is a 77 y.o. male   Here with 2-3 days acute onset fever, facial pain, pressure, headache, general weakness and malaise, and greenish d/c, with mild ST and cough, but pt denies chest pain, wheezing, increased sob or doe, orthopnea, PND, increased LE swelling, palpitations, dizziness or syncope.  Does have several wks ongoing nasal allergy symptoms with clearish congestion, itch and sneezing, without fever, pain, ST, cough, swelling or wheezing.   Pt denies polydipsia, polyuria, or new focal neuro s/s.          Wt Readings from Last 3 Encounters:  08/20/22 156 lb 3.2 oz (70.9 kg)  02/09/22 150 lb (68 kg)  12/08/21 154 lb (69.9 kg)   BP Readings from Last 3 Encounters:  08/20/22 130/80  12/08/21 122/84  01/12/20 120/80         Past Medical History:  Diagnosis Date   History of kidney stones    Hyperlipidemia    Osteoarthritis    Right inguinal hernia    Umbilical hernia    Past Surgical History:  Procedure Laterality Date   EXTRACORPOREAL SHOCK WAVE LITHOTRIPSY Left 10/29/2016   Procedure: LEFT EXTRACORPOREAL SHOCK WAVE LITHOTRIPSY (ESWL);  Surgeon: Hildred Laser, MD;  Location: [facility];  Service: Urology;  Laterality: Left;   HERNIA REPAIR     LIH   HERNIA REPAIR  12/06/10   umbilical hernia    INGUINAL HERNIA REPAIR Right 09/04/2018   Procedure: RIGHT INGUINAL HERNIA REPAIR WITH MESH;  Surgeon: Emelia Loron, MD;  Location: [facility];  Service: General;  Laterality: Right;   VASECTOMY      reports that he has never smoked. He has never used smokeless tobacco. He reports current alcohol use. He reports that he does not use drugs. family history is not on file. Allergies  Allergen Reactions   Crestor [Rosuvastatin] Other (See Comments)    myalgias   Lipitor  [Atorvastatin] Other (See Comments)    "feels funny inside"   Current Outpatient Medications on File Prior to Visit  Medication Sig Dispense Refill   tamsulosin (FLOMAX) 0.4 MG CAPS capsule Take 1 capsule (0.4 mg total) by mouth at bedtime. 90 capsule 3   No current facility-administered medications on file prior to visit.        ROS:  All others reviewed and negative.  Objective        PE:  BP 130/80 (BP Location: Left Arm, Patient Position: Sitting, Cuff Size: Large)   Pulse 77   Temp 98.3 F (36.8 C) (Oral)   Ht 5' 5.5" (1.664 m)   Wt 156 lb 3.2 oz (70.9 kg)   SpO2 96%   BMI 25.60 kg/m                 Constitutional: Pt appears in NAD               HENT: Head: NCAT.                Right Ear: External ear normal.                 Left Ear: External ear normal. Bilat tm's with mild erythema.  Max sinus areas mild tender.  Pharynx with mild erythema, no exudate  Eyes: . Pupils are equal, round, and reactive to light. Conjunctivae and EOM are normal               Nose: without d/c or deformity               Neck: Neck supple. Gross normal ROM               Cardiovascular: Normal rate and regular rhythm.                 Pulmonary/Chest: Effort normal and breath sounds without rales or wheezing.                Abd:  Soft, NT, ND, + BS, no organomegaly               Neurological: Pt is alert. At baseline orientation, motor grossly intact               Skin: Skin is warm. No rashes, no other new lesions, LE edema - none               Psychiatric: Pt behavior is normal without agitation   Micro: none  Cardiac tracings I have personally interpreted today:  none  Pertinent Radiological findings (summarize): none   Lab Results  Component Value Date   WBC 6.7 12/04/2021   HGB 14.7 12/04/2021   HCT 43.4 12/04/2021   PLT 257.0 12/04/2021   GLUCOSE 130 (H) 12/04/2021   CHOL 193 12/04/2021   TRIG 70.0 12/04/2021   HDL 36.70 (L) 12/04/2021   LDLCALC 142 (H)  12/04/2021   ALT 10 12/04/2021   AST 15 12/04/2021   NA 133 (L) 12/04/2021   K 4.0 12/04/2021   CL 101 12/04/2021   CREATININE 0.94 12/04/2021   BUN 15 12/04/2021   CO2 24 12/04/2021   TSH 1.35 12/04/2021   PSA 14.58 (H) 12/04/2021   HGBA1C 6.7 (H) 12/04/2021   SARS Coronavirus 2 Ag Negative Negative    Assessment/Plan:  [name] [name] is a 77 y.o. White or Caucasian [1] male with  has a past medical history of History of kidney stones, Hyperlipidemia, Osteoarthritis, Right inguinal hernia, and Umbilical hernia.  Acute sinus infection Mild to mod, for antibx course,  to f/u any worsening symptoms or concerns  Allergic rhinitis Mild to mod, for prednisone taper,  to f/u any worsening symptoms or concerns  Hyperglycemia Lab Results  Component Value Date   HGBA1C 6.7 (H) 12/04/2021   Stable, pt to continue current medical treatment  - diet, wt control  Vitamin D deficiency Last vitamin D Lab Results  Component Value Date   VD25OH 28.65 (L) 12/04/2021   Low, to start oral replacement  Followup: Return if symptoms worsen or fail to improve.  Oliver Barre, MD 08/21/2022 6:52 PM [facility] Internal [facility]

## 2022-08-20 NOTE — Patient Instructions (Signed)
Please take all new medication as prescribed  - the antibiotic, and prednisone  Please continue all other medications as before, and refills have been done if requested.  Please have the pharmacy call with any other refills you may need.  Please continue your efforts at being more active, low cholesterol diet, and weight control.  You are otherwise up to date with prevention measures today.  Please keep your appointments with your specialists as you may have planned

## 2022-08-21 ENCOUNTER — Encounter: Payer: Self-pay | Admitting: Internal Medicine

## 2022-08-21 DIAGNOSIS — J019 Acute sinusitis, unspecified: Secondary | ICD-10-CM | POA: Insufficient documentation

## 2022-08-21 NOTE — Assessment & Plan Note (Signed)
Mild to mod, for antibx course,  to f/u any worsening symptoms or concerns 

## 2022-08-21 NOTE — Assessment & Plan Note (Signed)
Last vitamin D Lab Results  Component Value Date   VD25OH 28.65 (L) 12/04/2021   Low, to start oral replacement

## 2022-08-21 NOTE — Assessment & Plan Note (Signed)
Mild to mod, for prednisone taper, to f/u any worsening symptoms or concerns 

## 2022-08-21 NOTE — Assessment & Plan Note (Signed)
Lab Results  Component Value Date   HGBA1C 6.7 (H) 12/04/2021   Stable, pt to continue current medical treatment  - diet, wt control

## 2022-11-22 ENCOUNTER — Encounter: Payer: Self-pay | Admitting: Dermatology

## 2022-11-22 ENCOUNTER — Other Ambulatory Visit (HOSPITAL_COMMUNITY): Payer: Self-pay

## 2022-11-22 ENCOUNTER — Ambulatory Visit: Payer: Medicare Other | Admitting: Dermatology

## 2022-11-22 VITALS — BP 120/80

## 2022-11-22 DIAGNOSIS — Z85828 Personal history of other malignant neoplasm of skin: Secondary | ICD-10-CM

## 2022-11-22 DIAGNOSIS — Z5111 Encounter for antineoplastic chemotherapy: Secondary | ICD-10-CM | POA: Diagnosis not present

## 2022-11-22 DIAGNOSIS — L570 Actinic keratosis: Secondary | ICD-10-CM

## 2022-11-22 DIAGNOSIS — L578 Other skin changes due to chronic exposure to nonionizing radiation: Secondary | ICD-10-CM | POA: Diagnosis not present

## 2022-11-22 DIAGNOSIS — W908XXA Exposure to other nonionizing radiation, initial encounter: Secondary | ICD-10-CM | POA: Diagnosis not present

## 2022-11-22 MED ORDER — FLUOROURACIL 5 % EX CREA
TOPICAL_CREAM | Freq: Two times a day (BID) | CUTANEOUS | 0 refills | Status: AC
Start: 2022-11-22 — End: 2022-12-22
  Filled 2022-11-22: qty 40, 30d supply, fill #0

## 2022-11-22 NOTE — Patient Instructions (Signed)
5-Fluorouracil Patient Education   Actinic keratoses are the dry, red scaly spots on the skin caused by sun damage. A portion of these spots can turn into skin cancer with time, and treating them can help prevent development of skin cancer.   Treatment of these spots requires removal of the defective skin cells. There are various ways to remove actinic keratoses, including freezing with liquid nitrogen, treatment with creams, or treatment with a blue light procedure in the office.   5-fluorouracil cream is a topical cream used to treat actinic keratoses. It works by interfering with the growth of abnormal fast-growing skin cells, such as actinic keratoses. These cells peel off and are replaced by healthy ones. THIS CREAM SHOULD BE KEPT OUT OF REACH OF CHILDREN AND PETS AND SHOULD NOT BE USED BY PREGNANT WOMEN.  INSTRUCTIONS FOR 5-FLUOROURACIL CREAM:   5-fluorouracil cream typically needs to be used for 7-14 days depending on the location of treatment. A thin layer should be applied twice a day to the treatment areas recommended by your physician.    Avoid contact with your eyes or nostrils. Avoid applying the cream to your eyelids or lips unless directed to apply there by your physician. Do not use 5-fluorouracil on infected or open wounds.   You will develop redness, irritation and some crusting at areas where you have pre-cancer damage/actinic keratoses. IF YOU DEVELOP PAIN, BLEEDING, OR SIGNIFICANT CRUSTING, STOP THE TREATMENT EARLY - you have already gotten a good response and the actinic keratoses should clear up well.  Wash your hands after applying 5-fluorouracil 5% cream on your skin.   A moisturizer or sunscreen with a minimum SPF 30 should be applied each morning.   Once you have finished the treatment, you can apply a thin layer of Vaseline twice a day to irritated areas to soothe and calm the areas more quickly. If you experience significant discomfort, contact your physician.  For  some patients it is necessary to repeat the treatment for best results.  SIDE EFFECTS: When using 5-fluorouracil cream, you may have mild irritation, such as redness, dryness, swelling, or a mild burning sensation. This usually resolves within 2 weeks. The more actinic keratoses you have, the more redness and inflammation you can expect during treatment. Eye irritation has been reported rarely. If this occurs, please let us know.   If you have any trouble using this cream, please send [place] a MyChart Message or call the office. If you have any other questions about this information, please do not hesitate to ask me before you leave the office or reach out on MyChart or by phone.

## 2022-11-22 NOTE — Progress Notes (Signed)
   New Patient Visit   Subjective  [name] [name] is a 77 y.o. male who presents for the following: He has a lesion of concern on the left side of nose, present for a few years, asymptomatic otherwise, but growing and peels.   Hx of BCC on nose in 2023- excision  The patient has spots, moles and lesions to be evaluated, some may be new or changing and the patient may have concern these could be cancer.   The following portions of the chart were reviewed this encounter and updated as appropriate: medications, allergies, medical history  Review of Systems:  No other skin or systemic complaints except as noted in HPI or Assessment and Plan.  Objective  Well appearing patient in no apparent distress; mood and affect are within normal limits.  A focused examination was performed of the following areas: scalp, head, eyes, ears, nose, lips, neck,   Relevant exam findings are noted in the Assessment and Plan.   Assessment & Plan   ACTINIC DAMAGE WITH PRECANCEROUS ACTINIC KERATOSES Counseling for Topical Chemotherapy Management: Patient exhibits: - Severe, confluent actinic changes with pre-cancerous actinic keratoses that is secondary to cumulative UV radiation exposure over time - Condition that is severe; chronic, not at goal. - diffuse scaly erythematous macules and papules with underlying dyspigmentation - Discussed Prescription "Field Treatment" topical Chemotherapy for Severe, Chronic Confluent Actinic Changes with Pre-Cancerous Actinic Keratoses Field treatment involves treatment of an entire area of skin that has confluent Actinic Changes (Sun/ Ultraviolet light damage) and PreCancerous Actinic Keratoses by method of PhotoDynamic Therapy (PDT) and/or prescription Topical Chemotherapy agents such as 5-fluorouracil, 5-fluorouracil/calcipotriene, and/or imiquimod.  The purpose is to decrease the number of clinically evident and subclinical PreCancerous lesions to prevent progression to  development of skin cancer by chemically destroying early precancer changes that may or may not be visible.  It has been shown to reduce the risk of developing skin cancer in the treated area. As a result of treatment, redness, scaling, crusting, and open sores may occur during treatment course. One or more than one of these methods may be used and may have to be used several times to control, suppress and eliminate the PreCancerous changes. Discussed treatment course, expected reaction, and possible side effects. - Recommend daily broad spectrum sunscreen SPF 30+ to sun-exposed areas, reapply every 2 hours as needed.  - Staying in the shade or wearing long sleeves, sun glasses (UVA+UVB protection) and wide brim hats (4-inch brim around the entire circumference of the hat) are also recommended. - Call for new or changing lesions. Actinic keratosis  Related Medications fluorouracil (EFUDEX) 5 % cream Apply topically 2 (two) times daily for 14 days. To nose, cheeks, temple, and forehead   Return in about 6 weeks (around 01/03/2023), or AK follow up.   Documentation: I have reviewed the above documentation for accuracy and completeness, and I agree with the above.  Gwenith Daily, MD

## 2022-11-23 ENCOUNTER — Other Ambulatory Visit: Payer: Self-pay | Admitting: Internal Medicine

## 2022-11-23 ENCOUNTER — Other Ambulatory Visit: Payer: Self-pay

## 2022-11-28 ENCOUNTER — Ambulatory Visit: Payer: Medicare Other | Admitting: Dermatology

## 2023-01-01 ENCOUNTER — Encounter: Payer: Self-pay | Admitting: Dermatology

## 2023-01-01 ENCOUNTER — Ambulatory Visit: Payer: Medicare Other | Admitting: Dermatology

## 2023-01-01 VITALS — BP 135/80 | HR 78

## 2023-01-01 DIAGNOSIS — L578 Other skin changes due to chronic exposure to nonionizing radiation: Secondary | ICD-10-CM

## 2023-01-01 DIAGNOSIS — D492 Neoplasm of unspecified behavior of bone, soft tissue, and skin: Secondary | ICD-10-CM | POA: Diagnosis not present

## 2023-01-01 DIAGNOSIS — C44311 Basal cell carcinoma of skin of nose: Secondary | ICD-10-CM

## 2023-01-01 DIAGNOSIS — D485 Neoplasm of uncertain behavior of skin: Secondary | ICD-10-CM

## 2023-01-01 DIAGNOSIS — L922 Granuloma faciale [eosinophilic granuloma of skin]: Secondary | ICD-10-CM

## 2023-01-01 DIAGNOSIS — L308 Other specified dermatitis: Secondary | ICD-10-CM | POA: Diagnosis not present

## 2023-01-01 DIAGNOSIS — W908XXD Exposure to other nonionizing radiation, subsequent encounter: Secondary | ICD-10-CM

## 2023-01-01 NOTE — Progress Notes (Signed)
   Follow-Up Visit   Subjective  [name] [name] is a 77 y.o. male who presents for the following: efudex treatment to face, he used it for 2 weeks, twice a day.   Pt here for 1 month f/u s/p efudex. Pt states he had a very good reaction to the topical 5FU and now doing well.   The following portions of the chart were reviewed this encounter and updated as appropriate: medications, allergies, medical history  Review of Systems:  No other skin or systemic complaints except as noted in HPI or Assessment and Plan.  Objective  Well appearing patient in no apparent distress; mood and affect are within normal limits.  A focused examination was performed of the following areas: Nose, cheeks, temple and forehead  Relevant exam findings are noted in the Assessment and Plan.  Left Malar Cheek Pink papule       left nasal ala Pink papule        Assessment & Plan   Neoplasm of uncertain behavior of skin (2) Left Malar Cheek  Skin / nail biopsy Type of biopsy: tangential   Informed consent: discussed and consent obtained   Timeout: patient name, date of birth, surgical site, and procedure verified   Procedure prep:  Patient was prepped and draped in usual sterile fashion Prep type:  Isopropyl alcohol Anesthesia: the lesion was anesthetized in a standard fashion   Anesthetic:  1% lidocaine w/ epinephrine 1-100,000 local infiltration Instrument used: DermaBlade   Hemostasis achieved with: suture, pressure and electrodesiccation   Outcome: patient tolerated procedure well   Post-procedure details: wound care instructions given    Specimen 1 - Surgical pathology Differential Diagnosis: R/O NMSC  Check Margins: No  left nasal ala  Skin / nail biopsy Type of biopsy: tangential   Informed consent: discussed and consent obtained   Timeout: patient name, date of birth, surgical site, and procedure verified   Procedure prep:  Patient was prepped and draped in usual sterile  fashion Prep type:  Chlorhexidine Anesthesia: the lesion was anesthetized in a standard fashion   Anesthetic:  1% lidocaine w/ epinephrine 1-100,000 local infiltration Instrument used: DermaBlade   Hemostasis achieved with: suture, pressure and electrodesiccation   Outcome: patient tolerated procedure with difficulty   Post-procedure details: wound care instructions given    Specimen 2 - Surgical pathology Differential Diagnosis: R/O NMSC  Check Margins: No  ACTINIC DAMAGE- Resolved - chronic, secondary to cumulative UV radiation exposure/sun exposure over time - diffuse scaly erythematous macules with underlying dyspigmentation - Recommend daily broad spectrum sunscreen SPF 30+ to sun-exposed areas, reapply every 2 hours as needed.  - Recommend staying in the shade or wearing long sleeves, sun glasses (UVA+UVB protection) and wide brim hats (4-inch brim around the entire circumference of the hat). - Call for new or changing lesions. - s/p topical 5FU for 2 weeks with good reaction  Return in about 6 months (around 07/01/2023) for TBSE, versus based on biopsy results.  I, Tillie Fantasia, CMA, am acting as scribe for Gwenith Daily, MD.   Documentation: I have reviewed the above documentation for accuracy and completeness, and I agree with the above.  Gwenith Daily, MD

## 2023-01-01 NOTE — Patient Instructions (Addendum)

## 2023-01-07 LAB — SURGICAL PATHOLOGY

## 2023-01-08 ENCOUNTER — Telehealth: Payer: Self-pay

## 2023-01-08 NOTE — Telephone Encounter (Signed)
-----   Message from [facility] PACI sent at 01/07/2023  6:34 PM EST ----- 1. Seb Derm- benign- left cheek 2. BCC- left ala- Mohs with me   Please call patient to discuss diagnosis and schedule for Mohs surgery.

## 2023-01-08 NOTE — Telephone Encounter (Signed)
Spoke with pt and gave him bx results and discussed treatment options

## 2023-01-14 ENCOUNTER — Encounter: Payer: Self-pay | Admitting: Dermatology

## 2023-01-16 ENCOUNTER — Ambulatory Visit (INDEPENDENT_AMBULATORY_CARE_PROVIDER_SITE_OTHER): Payer: Medicare Other | Admitting: Dermatology

## 2023-01-16 ENCOUNTER — Other Ambulatory Visit (HOSPITAL_COMMUNITY): Payer: Self-pay

## 2023-01-16 ENCOUNTER — Encounter: Payer: Self-pay | Admitting: Dermatology

## 2023-01-16 VITALS — BP 132/78 | HR 71 | Temp 99.5°F

## 2023-01-16 DIAGNOSIS — C44311 Basal cell carcinoma of skin of nose: Secondary | ICD-10-CM

## 2023-01-16 MED ORDER — MUPIROCIN 2 % EX OINT
1.0000 | TOPICAL_OINTMENT | Freq: Two times a day (BID) | CUTANEOUS | 0 refills | Status: DC
Start: 2023-01-16 — End: 2023-12-24
  Filled 2023-01-16: qty 22, 11d supply, fill #0

## 2023-01-16 NOTE — Patient Instructions (Signed)
Wound Care Instructions for After Surgery  On the day following your surgery, you should begin doing daily dressing changes until your sutures are removed: Remove the bandage. Cleanse the wound gently with soap and water.  Make sure you then dry the skin surrounding the wound completely or the tape will not stick to the skin. Do not use cotton balls on the wound. After the wound is clean and dry, apply the ointment (either prescription antibiotic prescribed by your doctor or plain Vaseline if nothing was prescribed) gently with a Q-tip. If you are using a bandaid to cover: Apply a bandaid large enough to cover the entire wound. If you do not have a bandaid large enough to cover the wound OR if you are sensitive to bandaid adhesive: Cut a non-stick pad (such as Telfa) to fit the size of the wound.  Cover the wound with the non-stick pad. If the wound is draining, you may want to add a small amount of gauze on top of the non-stick pad for a little added compression to the area. Use tape to seal the area completely.  For the next 1-2 weeks: Be sure to keep the wound moist with ointment 24/7 to ensure best healing. If you are unable to cover the wound with a bandage to hold the ointment in place, you may need to reapply the ointment several times a day. Do not bend over or lift heavy items to reduce the chance of elevated blood pressure to the wound. Do not participate in particularly strenuous activities.  Below is a list of dressing supplies you might need.  Cotton-tipped applicators - Q-tips Gauze pads (2x2 and/or 4x4) - All-Purpose Sponges New and clean tube of petroleum jelly (Vaseline) OR prescription antibiotic ointment if prescribed Either a bandaid large enough to cover the entire wound OR non-stick dressing material (Telfa) and Tape (Paper or Hypafix)  FOR ADULT SURGERY PATIENTS: If you need something for pain relief, you may take 1 extra strength Tylenol (acetaminophen) and 2  ibuprofen (200 mg) together every 4 hours as needed. (Do not take these medications if you are allergic to them or if you know you cannot take them for any other reason). Typically you may only need pain medication for 1-3 days.   Comments on the Post-Operative Period Slight swelling and redness often appear around the wound. This is normal and will disappear within several days following the surgery. The healing wound will drain a brownish-red-yellow discharge during healing. This is a normal phase of wound healing. As the wound begins to heal, the drainage may increase in amount. Again, this drainage is normal. Notify us if the drainage becomes persistently bloody, excessively swollen, or intensely painful or develops a foul odor or red streaks.  The healing wound will also typically be itchy. This is normal. If you have severe or persistent pain, Notify us if the discomfort is severe or persistent. Avoid alcoholic beverages when taking pain medicine.  In Case of Wound Hemorrhage A wound hemorrhage is when the bandage suddenly becomes soaked with bright red blood and flows profusely. If this happens, sit down or lie down with your head elevated. If the wound has a dressing on it, do not remove the dressing. Apply pressure to the existing gauze. If the wound is not covered, use a gauze pad to apply pressure and continue applying the pressure for 20 minutes without peeking. DO NOT COVER THE WOUND WITH A LARGE TOWEL OR WASH CLOTH. Release your hand from the  wound site but do not remove the dressing. If the bleeding has stopped, gently clean around the wound. Leave the dressing in place for 24 hours if possible. This wait time allows the blood vessels to close off so that you do not spark a new round of bleeding by disrupting the newly clotted blood vessels with an immediate dressing change. If the bleeding does not subside, continue to hold pressure for 40 minutes. If bleeding continues, page your  physician, contact an After Hours clinic or go to the Emergency Room.    Important Information  Due to recent changes in healthcare laws, you may see results of your pathology and/or laboratory studies on MyChart before the doctors have had a chance to review them. We understand that in some cases there may be results that are confusing or concerning to you. Please understand that not all results are received at the same time and often the doctors may need to interpret multiple results in order to provide you with the best plan of care or course of treatment. Therefore, we ask that you please give [place] 2 business days to thoroughly review all your results before contacting the office for clarification. Should we see a critical lab result, you will be contacted sooner.   If You Need Anything After Your Visit  If you have any questions or concerns for your doctor, please call our main line at [phone] If no one answers, please leave a voicemail as directed and we will return your call as soon as possible. Messages left after 4 pm will be answered the following business day.   You may also send [place] a message via MyChart. We typically respond to MyChart messages within 1-2 business days.  For prescription refills, please ask your pharmacy to contact our office. Our fax number is [phone].  If you have an urgent issue when the clinic is closed that cannot wait until the next business day, you can page your doctor at the number below.    Please note that while we do our best to be available for urgent issues outside of office hours, we are not available 24/7.   If you have an urgent issue and are unable to reach [place], you may choose to seek medical care at your doctor's office, retail clinic, urgent care center, or emergency room.  If you have a medical emergency, please immediately call 911 or go to the emergency department. In the event of inclement weather, please call our main line at  [phone] for an update on the status of any delays or closures.  Dermatology Medication Tips: Please keep the boxes that topical medications come in in order to help keep track of the instructions about where and how to use these. Pharmacies typically print the medication instructions only on the boxes and not directly on the medication tubes.   If your medication is too expensive, please contact our office at [phone] or send [place] a message through MyChart.   We are unable to tell what your co-pay for medications will be in advance as this is different depending on your insurance coverage. However, we may be able to find a substitute medication at lower cost or fill out paperwork to get insurance to cover a needed medication.   If a prior authorization is required to get your medication covered by your insurance company, please allow [place] 1-2 business days to complete this process.  Drug prices often vary depending on where the prescription is filled and  some pharmacies may offer cheaper prices.  The website www.goodrx.com contains coupons for medications through different pharmacies. The prices here do not account for what the cost may be with help from insurance (it may be cheaper with your insurance), but the website can give you the price if you did not use any insurance.  - You can print the associated coupon and take it with your prescription to the pharmacy.  - You may also stop by our office during regular business hours and pick up a GoodRx coupon card.  - If you need your prescription sent electronically to a different pharmacy, notify our office through [facility] or by phone at [phone]

## 2023-01-16 NOTE — Progress Notes (Signed)
Follow-Up Visit   Subjective  [name] [name] is a 77 y.o. male who presents for the following: Mohs of a nodular BCC on the pt's left nasal ala, biopsied by Dr. Caralyn Guile.  The following portions of the chart were reviewed this encounter and updated as appropriate: medications, allergies, medical history  Review of Systems:  No other skin or systemic complaints except as noted in HPI or Assessment and Plan.  Objective  Well appearing patient in no apparent distress; mood and affect are within normal limits.  A focused examination was performed of the following areas:  Left nasal ala  Relevant physical exam findings are noted in the Assessment and Plan.   Left Ala Nasi            Assessment & Plan   Basal cell carcinoma (BCC) of skin of nose Left Ala Nasi  Mohs surgery  Consent obtained: written  Anticoagulation: Is the patient taking prescription anticoagulant and/or aspirin prescribed/recommended by a physician? No   Was the anticoagulation regimen changed prior to Mohs? No    Procedure Details: Timeout: pre-procedure verification complete Procedure Prep: patient was prepped and draped in usual sterile fashion Prep type: chlorhexidine Biopsy accession number: [id number] Biopsy lab: [facility] path Date of biopsy: 01/01/2023 Frozen section biopsy performed: No   Specimen debulked: No   Pre-Op diagnosis: basal cell carcinoma BCC subtype: nodular MohsAIQ Surgical site (if tumor spans multiple areas, please select predominant area): nose Surgery side: left Surgical site (from skin exam): Left Ala Nasi Pre-operative length (cm): 0.9 Pre-operative width (cm): 0.7 Indications for Mohs surgery: anatomic location where tissue conservation is critical Previously treated? No    Micrographic Surgery Details: Post-operative length (cm): 1.2 Post-operative width (cm): 1 Number of Mohs stages: 2 Is this a complex case (associate members only): No    Stage 1     Tumor features identified on Mohs section: basal carcinoma    Depth of defect after stage: subcutaneous fat    Perineural invasion: no perineural invasion  Stage 2    Tumor features identified on Mohs section: no tumor identified    Depth of defect after stage: subcutaneous fat    Perineural invasion: no perineural invasion  Patient tolerance of procedure: tolerated well, no immediate complications  Reconstruction: Was the defect reconstructed?: No    Opioids: Did the patient recieve a prescription for opioid/narcotic related to Mohs surgery? Yes   Indications for opioid/narcotics: patient required additional pain relief despite trial of non-opioid analgesia  Antibiotics: Does patient meet AHA guidelines for endocarditis?: No   Does patient meet AHA guidelines for orthopedic prophylaxis?: No   Were antibiotics given on the day of surgery?: No   Did surgery breach mucosa, expose cartilage/bone, involve an area of lymphedema/inflamed/infected tissue? No    Related Medications mupirocin ointment (BACTROBAN) 2 % Apply 1 Application topically 2 (two) times daily.    Return in about 1 week (around 01/23/2023) for delayed repair.  Dominga Ferry, Surg Tech III, am acting as scribe for Gwenith Daily, MD.    01/16/2023  HISTORY OF PRESENT ILLNESS  [name] [name] is seen in consultation at the request of Dr. Caralyn Guile for biopsy-proven nodular Basal Cell Carcinoma on the left nasal ala. They note that the area has been present for about 6 months increasing in size with time.  There is no history of previous treatment.  Reports no other new or changing lesions and has no other complaints today.  Medications and allergies: see  patient chart.  Review of systems: Reviewed 8 systems and notable for the above skin cancer.  All other systems reviewed are unremarkable/negative, unless noted in the HPI. Past medical history, surgical history, family history, social history were also reviewed and  are noted in the chart/questionnaire.    PHYSICAL EXAMINATION  General: Well-appearing, in no acute distress, alert and oriented x 4. Vitals reviewed in chart (if available).   Skin: Exam reveals a 0.9 x 0.7 cm erythematous papule and biopsy scar on the left nasal ala. There are rhytids, telangiectasias, and lentigines, consistent with photodamage.   Biopsy report(s) reviewed, confirming the diagnosis.   ASSESSMENT  1) Nodular Basal Cell Carcinoma of the left nasal ala 2) photodamage 3) solar lentigines   PLAN   1. Due to location, size, histology, or recurrence and the likelihood of subclinical extension as well as the need to conserve normal surrounding tissue, the patient was deemed acceptable for Mohs micrographic surgery (MMS).  The nature and purpose of the procedure, associated benefits and risks including recurrence and scarring, possible complications such as pain, infection, and bleeding, and alternative methods of treatment if appropriate were discussed with the patient during consent. The lesion location was verified by the patient, by reviewing previous notes, pathology reports, and by photographs as well as angulation measurements if available.  Informed consent was reviewed and signed by the patient, and timeout was performed at 10:00AM. See op note below.  2. For the photodamage and solar lentigines, sun protection discussed/information given on OTC sunscreens, and we recommend continued regular follow-up with primary dermatologist every 6 months or sooner for any growing, bleeding, or changing lesions. 3. Prognosis and future surveillance discussed. 4. Letter with treatment outcome sent to referring provider. 5. Pain acetaminophen/ibuprofen  MOHS MICROGRAPHIC SURGERY AND RECONSTRUCTION  Initial size:   0.9 x 0.7 cm Surgical defect/wound size: 1.2 x 1.0 cm Anesthesia:    0.33% lidocaine with 1:200,000 epinephrine EBL:    <5 mL Complications:  None Repair  type:   Dealyed FTSG   Stages: 2  STAGE I: Anesthesia achieved with 0.5% lidocaine with 1:200,000 epinephrine. ChloraPrep applied. 1 section(s) excised using Mohs technique (this includes total peripheral and deep tissue margin excision and evaluation with frozen sections, excised and interpreted by the same physician). The tumor was first debulked and then excised with an approx. 2 mm margin.  Hemostasis was achieved with electrocautery as needed.  The specimen was then oriented, subdivided/relaxed, inked, and processed using Mohs technique.    Frozen section analysis revealed a positive margin for islands of cells with peripheral palisading and a haphazard arrangement of the more central cells in the deep and peripheral margin.    STAGE II: An additional 2 mm margin was excised.  Hemostasis was achieved with electrocautery as needed.  The specimen was then oriented, subdivided/relaxed, inked, and processed using Mohs technique. Evaluation of slides by the Mohs surgeon revealed clear tumor margins.  Reconstruction  Delayed Skin Graft  The decision to delay the skin graft for the reconstruction of the Mohs wound until one week post-procedure is based on the goal of allowing the wound to undergo secondary intention healing. This approach facilitates the gradual granulation of tissue and the reduction of wound depth, which can improve the overall quality and viability of the eventual graft site. By allowing the wound to heal partially through secondary intention, we anticipate enhanced wound bed preparation, optimizing conditions for graft take and minimizing the risk of graft failure. This waiting period  also allows for a better assessment of the wound's healing potential and ensures that the tissue is sufficiently vascularized, reducing the likelihood of complications such as graft necrosis or infection. Therefore, the delayed grafting is a strategic measure to enhance the long-term outcome of the  reconstructive procedure.  The patient will follow up in: 1 week  - mupirocin ointment (BACTROBAN) 2 %; Apply 1 Application topically 2 (two) times daily.  Dispense: 22 g; Refill: 0  Documentation: I have reviewed the above documentation for accuracy and completeness, and I agree with the above.  Gwenith Daily, MD

## 2023-01-17 ENCOUNTER — Encounter: Payer: Self-pay | Admitting: Dermatology

## 2023-01-22 ENCOUNTER — Encounter: Payer: Self-pay | Admitting: Dermatology

## 2023-01-23 ENCOUNTER — Encounter: Payer: Self-pay | Admitting: Dermatology

## 2023-01-23 ENCOUNTER — Ambulatory Visit: Payer: Medicare Other | Admitting: Dermatology

## 2023-01-23 VITALS — BP 123/69 | HR 102 | Temp 97.7°F

## 2023-01-23 DIAGNOSIS — Z85828 Personal history of other malignant neoplasm of skin: Secondary | ICD-10-CM

## 2023-01-23 DIAGNOSIS — C44311 Basal cell carcinoma of skin of nose: Secondary | ICD-10-CM

## 2023-01-23 NOTE — Progress Notes (Signed)
Follow-Up Visit   Subjective  [name] [name] is a 77 y.o. male who presents for the following: Repair from a [facility] on pt left nasal ala on 01/16/23. He is here for a delayed skin graft. He states that he has been using mupirocin ointment daily with no pain.  Review of Systems:  No other skin or systemic complaints except as noted in HPI or Assessment and Plan.  Objective  Well appearing patient in no apparent distress; mood and affect are within normal limits.  A focused examination was performed of the following areas:  Nose and face  Relevant exam findings are noted in the Assessment and Plan.    Assessment & Plan     HISTORY OF BASAL CELL CARCINOMA (BCC) Left Tip of Nose Skin repair - Left Tip of Nose Complexity:  Complex Informed consent: discussed and consent obtained   Timeout: patient name, date of birth, surgical site, and procedure verified   Procedure prep:  Patient was prepped and draped in usual sterile fashion Prep type:  Chlorhexidine Anesthesia: the lesion was anesthetized in a standard fashion   Anesthetic:  1% lidocaine w/ epinephrine 1-100,000 buffered w/ 8.4% NaHCO3 Reason for type of repair: reduce tension to allow closure and preserve normal anatomy   Undermining: edges could be approximated without difficulty   Subcutaneous layers (deep stitches):  Suture size:  5-0 Suture type: Monocryl (poliglecaprone 25)   Stitches:  Buried horizontal mattress Fine/surface layer approximation (top stitches):  Suture size:  5-0 Suture type: Prolene (polypropylene)   Suture removal (days):  7 Hemostasis achieved with: suture, pressure and electrodesiccation Outcome: patient tolerated procedure well with no complications   Post-procedure details: sterile dressing applied and wound care instructions given   Dressing type: bacitracin and pressure dressing   Additional details:  Patient defect repaired with FTSG from Left preauricular area.  Wound Bed  Preparation  The patient was prepared for a full-thickness skin graft procedure . The wound bed is thoroughly assessed and prepared to optimize graft take and healing. The necrotic tissue is debrided to create a clean, viable base, and any infected tissue is removed to reduce the risk of graft failure. Hemostasis is achieved to prevent bleeding complications during the grafting process. The wound edges are carefully undermined, and the underlying tissues are inspected for adequate vascularity to support the graft. The wound is then thoroughly irrigated, and the area is prepped with antiseptic solutions to minimize the risk of infection. Once the wound bed is adequately prepared, it is covered with a moist dressing in preparation for the skin graft placement.  Operation:  Full thickness skin graft repair of the above MMS defect Indication:  Functional and aesthetic reconstruction of the above wound Donor site:  Left preauricular Donor site suture: 5-0 Monocryl     FTSG suture:  5-0 Polyprolene Graft length and width: 1.2 x 0.9 cm Graft surface area: 1.08 cm2 Postoperative medications: None Complications:  None  Due to the size, depth, and location, apposition could not be obtained without significant tension and distortion of adjacent tissue and structures. We discussed repair options including graft, flap, and second intention. We chose to proceed with a Full Thickness Skin Graft.  Therefore, a template/measurement of the defect was made to pattern the excision in the donor site that was then aseptically prepped, anesthetized, and excised.  The donor defect was closed without significant tension.  Hemostasis was achieved with electrocautery.  The graft was then defatted, trimmed, and placed on the  recipient site.  The circumference of the graft was secured with the above suture.  Internal bolstering stitches were placed as needed.  The surgical site was then lightly scrubbed with sterile, saline-soaked  gauze.  The area was then bandaged using Vaseline ointment, non-adherent gauze, gauze pads, and tape to provide an adequate pressure dressing.   The patient tolerated the procedure well, was given detailed written and verbal wound care instructions, and was discharged in good condition.  Sun protection and sun avoidance was also discussed. The patient will follow-up in 9 days.   Return in about 1 week (around 01/30/2023) for suture removal.  I, Bernette Redbird, Surg Tech III, am acting as scribe for Gwenith Daily, MD.   Documentation: I have reviewed the above documentation for accuracy and completeness, and I agree with the above.  Gwenith Daily, MD

## 2023-01-23 NOTE — Patient Instructions (Addendum)
 Wound Care Instructions for After Surgery  On the day following your surgery, you should begin doing daily dressing changes until your sutures are removed: Remove the bandage. Cleanse the wound gently with soap and water.  Make sure you then dry the skin surrounding the wound completely or the tape will not stick to the skin. Do not use cotton balls on the wound. After the wound is clean and dry, apply the ointment (either prescription antibiotic prescribed by your doctor or plain Vaseline if nothing was prescribed) gently with a Q-tip. If you are using a bandaid to cover: Apply a bandaid large enough to cover the entire wound. If you do not have a bandaid large enough to cover the wound OR if you are sensitive to bandaid adhesive: Cut a non-stick pad (such as Telfa) to fit the size of the wound.  Cover the wound with the non-stick pad. If the wound is draining, you may want to add a small amount of gauze on top of the non-stick pad for a little added compression to the area. Use tape to seal the area completely.  For the next 1-2 weeks: Be sure to keep the wound moist with ointment 24/7 to ensure best healing. If you are unable to cover the wound with a bandage to hold the ointment in place, you may need to reapply the ointment several times a day. Do not bend over or lift heavy items to reduce the chance of elevated blood pressure to the wound. Do not participate in particularly strenuous activities.  Below is a list of dressing supplies you might need.  Cotton-tipped applicators - Q-tips Gauze pads (2x2 and/or 4x4) - All-Purpose Sponges New and clean tube of petroleum jelly (Vaseline) OR prescription antibiotic ointment if prescribed Either a bandaid large enough to cover the entire wound OR non-stick dressing material (Telfa) and Tape (Paper or Hypafix)  FOR ADULT SURGERY PATIENTS: If you need something for pain relief, you may take 1 extra strength Tylenol (acetaminophen) and 2  ibuprofen (200 mg) together every 4 hours as needed. (Do not take these medications if you are allergic to them or if you know you cannot take them for any other reason). Typically you may only need pain medication for 1-3 days.   Comments on the Post-Operative Period Slight swelling and redness often appear around the wound. This is normal and will disappear within several days following the surgery. The healing wound will drain a brownish-red-yellow discharge during healing. This is a normal phase of wound healing. As the wound begins to heal, the drainage may increase in amount. Again, this drainage is normal. Notify us if the drainage becomes persistently bloody, excessively swollen, or intensely painful or develops a foul odor or red streaks.  The healing wound will also typically be itchy. This is normal. If you have severe or persistent pain, Notify us if the discomfort is severe or persistent. Avoid alcoholic beverages when taking pain medicine.  In Case of Wound Hemorrhage A wound hemorrhage is when the bandage suddenly becomes soaked with bright red blood and flows profusely. If this happens, sit down or lie down with your head elevated. If the wound has a dressing on it, do not remove the dressing. Apply pressure to the existing gauze. If the wound is not covered, use a gauze pad to apply pressure and continue applying the pressure for 20 minutes without peeking. DO NOT COVER THE WOUND WITH A LARGE TOWEL OR WASH CLOTH. Release your hand from the  wound site but do not remove the dressing. If the bleeding has stopped, gently clean around the wound. Leave the dressing in place for 24 hours if possible. This wait time allows the blood vessels to close off so that you do not spark a new round of bleeding by disrupting the newly clotted blood vessels with an immediate dressing change. If the bleeding does not subside, continue to hold pressure for 40 minutes. If bleeding continues, page your  physician, contact an After Hours clinic or go to the Emergency Room.    Important Information  Due to recent changes in healthcare laws, you may see results of your pathology and/or laboratory studies on MyChart before the doctors have had a chance to review them. We understand that in some cases there may be results that are confusing or concerning to you. Please understand that not all results are received at the same time and often the doctors may need to interpret multiple results in order to provide you with the best plan of care or course of treatment. Therefore, we ask that you please give [place] 2 business days to thoroughly review all your results before contacting the office for clarification. Should we see a critical lab result, you will be contacted sooner.   If You Need Anything After Your Visit  If you have any questions or concerns for your doctor, please call our main line at [phone] If no one answers, please leave a voicemail as directed and we will return your call as soon as possible. Messages left after 4 pm will be answered the following business day.   You may also send [place] a message via MyChart. We typically respond to MyChart messages within 1-2 business days.  For prescription refills, please ask your pharmacy to contact our office. Our fax number is [phone].  If you have an urgent issue when the clinic is closed that cannot wait until the next business day, you can page your doctor at the number below.    Please note that while we do our best to be available for urgent issues outside of office hours, we are not available 24/7.   If you have an urgent issue and are unable to reach [place], you may choose to seek medical care at your doctor's office, retail clinic, urgent care center, or emergency room.  If you have a medical emergency, please immediately call 911 or go to the emergency department. In the event of inclement weather, please call our main line at  [phone] for an update on the status of any delays or closures.  Dermatology Medication Tips: Please keep the boxes that topical medications come in in order to help keep track of the instructions about where and how to use these. Pharmacies typically print the medication instructions only on the boxes and not directly on the medication tubes.   If your medication is too expensive, please contact our office at [phone] or send [place] a message through MyChart.   We are unable to tell what your co-pay for medications will be in advance as this is different depending on your insurance coverage. However, we may be able to find a substitute medication at lower cost or fill out paperwork to get insurance to cover a needed medication.   If a prior authorization is required to get your medication covered by your insurance company, please allow [place] 1-2 business days to complete this process.  Drug prices often vary depending on where the prescription is filled and  some pharmacies may offer cheaper prices.  The website www.goodrx.com contains coupons for medications through different pharmacies. The prices here do not account for what the cost may be with help from insurance (it may be cheaper with your insurance), but the website can give you the price if you did not use any insurance.  - You can print the associated coupon and take it with your prescription to the pharmacy.  - You may also stop by our office during regular business hours and pick up a GoodRx coupon card.  - If you need your prescription sent electronically to a different pharmacy, notify our office through [facility] or by phone at [phone]

## 2023-02-01 ENCOUNTER — Encounter: Payer: Self-pay | Admitting: Dermatology

## 2023-02-01 ENCOUNTER — Ambulatory Visit: Payer: Medicare Other | Admitting: Dermatology

## 2023-02-01 VITALS — BP 137/73 | HR 74

## 2023-02-01 DIAGNOSIS — Z48817 Encounter for surgical aftercare following surgery on the skin and subcutaneous tissue: Secondary | ICD-10-CM

## 2023-02-01 DIAGNOSIS — C44311 Basal cell carcinoma of skin of nose: Secondary | ICD-10-CM

## 2023-02-01 DIAGNOSIS — Z85828 Personal history of other malignant neoplasm of skin: Secondary | ICD-10-CM

## 2023-02-01 DIAGNOSIS — T1490XD Injury, unspecified, subsequent encounter: Secondary | ICD-10-CM

## 2023-02-01 NOTE — Progress Notes (Signed)
   Follow Up Visit   Subjective  [name] [name] is a 77 y.o. male who presents for the following: follow up from Mohs surgery   The patient presents for follow up from Mohs surgery for a BCC on the left tip of nose, treated on 01/16/23, repaired with FTSG. The patient has been bandaging the wound as directed. The endorse the following concerns: no questions or concerns at this time.   The following portions of the chart were reviewed this encounter and updated as appropriate: medications, allergies, medical history  Review of Systems:  No other skin or systemic complaints except as noted in HPI or Assessment and Plan.  Objective  Well appearing patient in no apparent distress; mood and affect are within normal limits.  A full examination was performed including scalp, head, and face. All findings within normal limits unless otherwise noted below.  Healing wound with mild erythema  Relevant physical exam findings are noted in the Assessment and Plan.    Assessment & Plan   Healing s/p Mohs for [facility], treated on 01/16/23, repaired with FTSG - Graft appears dried - Discussed importance of keeping it very moist with copious amounts of emollient and covered as often as necessary - Suture will remain until follow up visit - No evidence of infection - No swelling, induration, purulence, dehiscence, or tenderness out of proportion to the clinical exam, see photo above - Discussed that scars take up to 12 months to mature from the date of surgery - Ok to continue ointment daily to wound under a bandage for another 2 wks    Return in about 2 weeks (around 02/15/2023) for f/u for suture removal.   Documentation: I have reviewed the above documentation for accuracy and completeness, and I agree with the above.  Gwenith Daily, MD

## 2023-02-01 NOTE — Patient Instructions (Signed)
Wound Care Instructions for After Surgery  On the day following your surgery, you should begin doing daily dressing changes until your sutures are removed:  Remove the bandage.  Cleanse the wound gently with soap and water.   Make sure you then dry the skin surrounding the wound completely or the tape will not stick to the skin. Do not use cotton balls on the wound.  After the wound is clean and dry, apply the ointment (either prescription antibiotic prescribed by your doctor or plain Vaseline if nothing was prescribed) gently with a Q-tip.  If you are using a bandaid to cover:  Apply a bandaid large enough to cover the entire wound.  If you do not have a bandaid large enough to cover the wound OR if you are sensitive to bandaid adhesive:  Cut a non-stick pad (such as Telfa) to fit the size of the wound.   Cover the wound with the non-stick pad.  If the wound is draining, you may want to add a small amount of gauze on top of the non-stick pad for a little added compression to the area.  Use tape to seal the area completely.  For the next 1-2 weeks:  Be sure to keep the wound moist with ointment 24/7 to ensure best healing. If you are unable to cover the wound with a bandage to hold the ointment in place, you may need to reapply the ointment several times a day.  Do not bend over or lift heavy items to reduce the chance of elevated blood pressure to the wound.  Do not participate in particularly strenuous activities.  Below is a list of dressing supplies you might need.   Cotton-tipped applicators - Q-tips  Gauze pads (2x2 and/or 4x4) - All-Purpose Sponges  New and clean tube of petroleum jelly (Vaseline) OR prescription antibiotic ointment if prescribed  Either a bandaid large enough to cover the entire wound OR non-stick dressing material (Telfa) and Tape (Paper or Hypafix)  FOR ADULT SURGERY PATIENTS: If you need something for pain relief, you may take 1 extra strength  Tylenol (acetaminophen) and 2 ibuprofen (200 mg) together every 4 hours as needed. (Do not take these medications if you are allergic to them or if you know you cannot take them for any other reason). Typically you may only need pain medication for 1-3 days.   Comments on the Post-Operative Period Slight swelling and redness often appear around the wound. This is normal and will disappear within several days following the surgery. The healing wound will drain a brownish-red-yellow discharge during healing. This is a normal phase of wound healing. As the wound begins to heal, the drainage may increase in amount. Again, this drainage is normal. Notify us if the drainage becomes persistently bloody, excessively swollen, or intensely painful or develops a foul odor or red streaks.  The healing wound will also typically be itchy. This is normal. If you have severe or persistent pain, Notify us if the discomfort is severe or persistent. Avoid alcoholic beverages when taking pain medicine.  In Case of Wound Hemorrhage A wound hemorrhage is when the bandage suddenly becomes soaked with bright red blood and flows profusely. If this happens, sit down or lie down with your head elevated. If the wound has a dressing on it, do not remove the dressing. Apply pressure to the existing gauze. If the wound is not covered, use a gauze pad to apply pressure and continue applying the pressure for 20 minutes without   peeking. DO NOT COVER THE WOUND WITH A LARGE TOWEL OR WASH CLOTH. Release your hand from the wound site but do not remove the dressing. If the bleeding has stopped, gently clean around the wound. Leave the dressing in place for 24 hours if possible. This wait time allows the blood vessels to close off so that you do not spark a new round of bleeding by disrupting the newly clotted blood vessels with an immediate dressing change. If the bleeding does not subside, continue to hold pressure for 40 minutes. If bleeding  continues, page your physician, contact an After Hours clinic or go to the Emergency Room. ?  

## 2023-02-12 ENCOUNTER — Ambulatory Visit: Payer: Medicare Other | Admitting: Dermatology

## 2023-02-12 ENCOUNTER — Encounter: Payer: Self-pay | Admitting: Dermatology

## 2023-02-12 VITALS — BP 159/86 | HR 80

## 2023-02-12 DIAGNOSIS — T1490XD Injury, unspecified, subsequent encounter: Secondary | ICD-10-CM

## 2023-02-12 DIAGNOSIS — Z48817 Encounter for surgical aftercare following surgery on the skin and subcutaneous tissue: Secondary | ICD-10-CM

## 2023-02-12 DIAGNOSIS — Z85828 Personal history of other malignant neoplasm of skin: Secondary | ICD-10-CM

## 2023-02-12 DIAGNOSIS — C44311 Basal cell carcinoma of skin of nose: Secondary | ICD-10-CM

## 2023-02-12 NOTE — Patient Instructions (Signed)
Wound Care Instructions for After Surgery  On the day following your surgery, you should begin doing daily dressing changes until your sutures are removed:  Remove the bandage.  Cleanse the wound gently with soap and water.   Make sure you then dry the skin surrounding the wound completely or the tape will not stick to the skin. Do not use cotton balls on the wound.  After the wound is clean and dry, apply the ointment (either prescription antibiotic prescribed by your doctor or plain Vaseline if nothing was prescribed) gently with a Q-tip.  If you are using a bandaid to cover:  Apply a bandaid large enough to cover the entire wound.  If you do not have a bandaid large enough to cover the wound OR if you are sensitive to bandaid adhesive:  Cut a non-stick pad (such as Telfa) to fit the size of the wound.   Cover the wound with the non-stick pad.  If the wound is draining, you may want to add a small amount of gauze on top of the non-stick pad for a little added compression to the area.  Use tape to seal the area completely.  For the next 1-2 weeks:  Be sure to keep the wound moist with ointment 24/7 to ensure best healing. If you are unable to cover the wound with a bandage to hold the ointment in place, you may need to reapply the ointment several times a day.  Do not bend over or lift heavy items to reduce the chance of elevated blood pressure to the wound.  Do not participate in particularly strenuous activities.  Below is a list of dressing supplies you might need.   Cotton-tipped applicators - Q-tips  Gauze pads (2x2 and/or 4x4) - All-Purpose Sponges  New and clean tube of petroleum jelly (Vaseline) OR prescription antibiotic ointment if prescribed  Either a bandaid large enough to cover the entire wound OR non-stick dressing material (Telfa) and Tape (Paper or Hypafix)  FOR ADULT SURGERY PATIENTS: If you need something for pain relief, you may take 1 extra strength  Tylenol (acetaminophen) and 2 ibuprofen (200 mg) together every 4 hours as needed. (Do not take these medications if you are allergic to them or if you know you cannot take them for any other reason). Typically you may only need pain medication for 1-3 days.   Comments on the Post-Operative Period Slight swelling and redness often appear around the wound. This is normal and will disappear within several days following the surgery. The healing wound will drain a brownish-red-yellow discharge during healing. This is a normal phase of wound healing. As the wound begins to heal, the drainage may increase in amount. Again, this drainage is normal. Notify us if the drainage becomes persistently bloody, excessively swollen, or intensely painful or develops a foul odor or red streaks.  The healing wound will also typically be itchy. This is normal. If you have severe or persistent pain, Notify us if the discomfort is severe or persistent. Avoid alcoholic beverages when taking pain medicine.  In Case of Wound Hemorrhage A wound hemorrhage is when the bandage suddenly becomes soaked with bright red blood and flows profusely. If this happens, sit down or lie down with your head elevated. If the wound has a dressing on it, do not remove the dressing. Apply pressure to the existing gauze. If the wound is not covered, use a gauze pad to apply pressure and continue applying the pressure for 20 minutes without   peeking. DO NOT COVER THE WOUND WITH A LARGE TOWEL OR WASH CLOTH. Release your hand from the wound site but do not remove the dressing. If the bleeding has stopped, gently clean around the wound. Leave the dressing in place for 24 hours if possible. This wait time allows the blood vessels to close off so that you do not spark a new round of bleeding by disrupting the newly clotted blood vessels with an immediate dressing change. If the bleeding does not subside, continue to hold pressure for 40 minutes. If bleeding  continues, page your physician, contact an After Hours clinic or go to the Emergency Room. ?  

## 2023-02-12 NOTE — Progress Notes (Signed)
   Follow Up Visit   Subjective  [name] [name] is a 78 y.o. male who presents for the following: follow up from Mohs surgery   The patient presents for follow up from Mohs surgery for a BCC on the Left tip of nose, treated on 01/16/23, repaired with FTSG. The patient has been bandaging the wound as directed. The endorse the following concerns: no questions or concerns at this time.   The following portions of the chart were reviewed this encounter and updated as appropriate: medications, allergies, medical history  Review of Systems:  No other skin or systemic complaints except as noted in HPI or Assessment and Plan.  Objective  Well appearing patient in no apparent distress; mood and affect are within normal limits.  A full examination was performed including scalp, head, and face. All findings within normal limits unless otherwise noted below.  Healing wound with mild erythema  Relevant physical exam findings are noted in the Assessment and Plan.       Assessment & Plan    Healing s/p Mohs for BCC, treated on left tip of nose, repaired with FTSG - Reassured that wound is healing well - No evidence of infection - No swelling, induration, purulence, dehiscence, or tenderness out of proportion to the clinical exam, see photo above - Discussed that scars take up to 12 months to mature from the date of surgery - Recommend SPF 30+ to scar daily to prevent purple color from UV exposure during scar maturation process - Discussed that erythema and raised appearance of scar will fade over the next 4-6 months - Ok to continue ointment daily to wound under a bandage for another 1-2 weeks or until fully healed. - Will consider dermabrasion at next appointment      No follow-ups on file.  LILLETTE Berwyn Baseman, Surg Tech III, am acting as scribe for RUFUS CHRISTELLA HOLY, MD.   Documentation: I have reviewed the above documentation for accuracy and completeness, and I agree with the  above.  RUFUS CHRISTELLA HOLY, MD

## 2023-03-18 ENCOUNTER — Ambulatory Visit: Payer: Medicare Other | Admitting: Dermatology

## 2023-03-26 ENCOUNTER — Ambulatory Visit (INDEPENDENT_AMBULATORY_CARE_PROVIDER_SITE_OTHER): Payer: Medicare Other | Admitting: Dermatology

## 2023-03-26 ENCOUNTER — Encounter: Payer: Self-pay | Admitting: Dermatology

## 2023-03-26 VITALS — BP 156/85 | HR 81

## 2023-03-26 DIAGNOSIS — L905 Scar conditions and fibrosis of skin: Secondary | ICD-10-CM

## 2023-03-26 DIAGNOSIS — C44311 Basal cell carcinoma of skin of nose: Secondary | ICD-10-CM

## 2023-03-26 DIAGNOSIS — Z85828 Personal history of other malignant neoplasm of skin: Secondary | ICD-10-CM

## 2023-03-26 DIAGNOSIS — Z48817 Encounter for surgical aftercare following surgery on the skin and subcutaneous tissue: Secondary | ICD-10-CM

## 2023-03-26 NOTE — Patient Instructions (Signed)

## 2023-03-26 NOTE — Progress Notes (Signed)
   Follow Up Visit   Subjective  [name] [name] is a 78 y.o. male who presents for the following: follow up from Mohs surgery.  The patient presents for follow up from Mohs surgery for a BCC on the left tip of nose, treated on 01/16/23, repaired with FTSG. The patient has been bandaging the wound as directed. The endorse the following concerns: No questions or concerns at this time.  The following portions of the chart were reviewed this encounter and updated as appropriate: medications, allergies, medical history  Review of Systems:  No other skin or systemic complaints except as noted in HPI or Assessment and Plan.  Objective  Well appearing patient in no apparent distress; mood and affect are within normal limits.  A full examination was performed including scalp, head, face and Nose. All findings within normal limits unless otherwise noted below.  Healing wound with mild erythema  Relevant physical exam findings are noted in the Assessment and Plan.    Assessment & Plan   Healing s/p Mohs for [facility], treated on 01/16/23, repaired with FTSG - Reassured that wound is healing well - No evidence of infection - No swelling, induration, purulence, dehiscence, or tenderness out of proportion to the clinical exam, see photo above - Discussed that scars take up to 12 months to mature from the date of surgery - Recommend SPF 30+ to scar daily to prevent purple color from UV exposure during scar maturation process - Discussed that erythema and raised appearance of scar will fade over the next 4-6 months - OK to start scar massage at 4-6 weeks post-op - Can consider silicone based products for scar healing starting at 6 weeks post-op - Ok to discontinue ointment daily to wound.  HISTORY OF BASAL CELL CARCINOMA OF THE SKIN - No evidence of recurrence today - Recommend regular full body skin exams - Recommend daily broad spectrum sunscreen SPF 30+ to sun-exposed areas, reapply every 2 hours  as needed.  - Call if any new or changing lesions are noted between office visits  Return in about 6 months (around 09/23/2023) for tbsc.  Dominga Ferry, Surg Tech III, am acting as scribe for Gwenith Daily, MD.   Documentation: I have reviewed the above documentation for accuracy and completeness, and I agree with the above.  Gwenith Daily, MD

## 2023-03-28 ENCOUNTER — Ambulatory Visit: Payer: Medicare Other | Admitting: Dermatology

## 2023-09-05 ENCOUNTER — Encounter: Payer: Self-pay | Admitting: Dermatology

## 2023-09-24 ENCOUNTER — Ambulatory Visit: Payer: Medicare Other | Admitting: Dermatology

## 2023-11-20 ENCOUNTER — Other Ambulatory Visit: Payer: Self-pay | Admitting: Internal Medicine

## 2023-12-18 ENCOUNTER — Telehealth: Payer: Self-pay

## 2023-12-18 DIAGNOSIS — R739 Hyperglycemia, unspecified: Secondary | ICD-10-CM

## 2023-12-18 DIAGNOSIS — E559 Vitamin D deficiency, unspecified: Secondary | ICD-10-CM

## 2023-12-18 DIAGNOSIS — R972 Elevated prostate specific antigen [PSA]: Secondary | ICD-10-CM

## 2023-12-18 DIAGNOSIS — E538 Deficiency of other specified B group vitamins: Secondary | ICD-10-CM

## 2023-12-18 DIAGNOSIS — E7849 Other hyperlipidemia: Secondary | ICD-10-CM

## 2023-12-18 NOTE — Telephone Encounter (Signed)
[place] labs are ordered, thanks

## 2023-12-18 NOTE — Telephone Encounter (Signed)
 Patient has an appt 11/18 and is asking to come in the day or wo before for labs. Please advise and order if appropriate.

## 2023-12-19 NOTE — Telephone Encounter (Signed)
 Tried to call and schedule appt for labs. No answer and VM not set up. Mychart message sent.

## 2023-12-20 ENCOUNTER — Other Ambulatory Visit (INDEPENDENT_AMBULATORY_CARE_PROVIDER_SITE_OTHER)

## 2023-12-20 DIAGNOSIS — E538 Deficiency of other specified B group vitamins: Secondary | ICD-10-CM | POA: Diagnosis not present

## 2023-12-20 DIAGNOSIS — R972 Elevated prostate specific antigen [PSA]: Secondary | ICD-10-CM | POA: Diagnosis not present

## 2023-12-20 DIAGNOSIS — E7849 Other hyperlipidemia: Secondary | ICD-10-CM | POA: Diagnosis not present

## 2023-12-20 DIAGNOSIS — R739 Hyperglycemia, unspecified: Secondary | ICD-10-CM | POA: Diagnosis not present

## 2023-12-20 DIAGNOSIS — E559 Vitamin D deficiency, unspecified: Secondary | ICD-10-CM | POA: Diagnosis not present

## 2023-12-20 LAB — HEPATIC FUNCTION PANEL
ALT: 12 U/L (ref 0–53)
AST: 15 U/L (ref 0–37)
Albumin: 4.2 g/dL (ref 3.5–5.2)
Alkaline Phosphatase: 79 U/L (ref 39–117)
Bilirubin, Direct: 0.2 mg/dL (ref 0.0–0.3)
Total Bilirubin: 0.9 mg/dL (ref 0.2–1.2)
Total Protein: 6.9 g/dL (ref 6.0–8.3)

## 2023-12-20 LAB — URINALYSIS, ROUTINE W REFLEX MICROSCOPIC
Ketones, ur: NEGATIVE
Leukocytes,Ua: NEGATIVE
Nitrite: NEGATIVE
Specific Gravity, Urine: 1.025 (ref 1.000–1.030)
Urine Glucose: NEGATIVE
Urobilinogen, UA: 0.2 (ref 0.0–1.0)
pH: 5.5 (ref 5.0–8.0)

## 2023-12-20 LAB — LIPID PANEL
Cholesterol: 200 mg/dL (ref 0–200)
HDL: 42.4 mg/dL (ref >39.00)
LDL Cholesterol: 140 mg/dL — ABNORMAL HIGH (ref 0–99)
NonHDL: 157.58
Total CHOL/HDL Ratio: 5
Triglycerides: 88 mg/dL (ref 0.0–149.0)
VLDL: 17.6 mg/dL (ref 0.0–40.0)

## 2023-12-20 LAB — CBC WITH DIFFERENTIAL/PLATELET
Basophils Absolute: 0.1 K/uL (ref 0.0–0.1)
Basophils Relative: 0.8 % (ref 0.0–3.0)
Eosinophils Absolute: 0.2 K/uL (ref 0.0–0.7)
Eosinophils Relative: 2.6 % (ref 0.0–5.0)
HCT: 43.1 % (ref 39.0–52.0)
Hemoglobin: 14.6 g/dL (ref 13.0–17.0)
Lymphocytes Relative: 18.6 % (ref 12.0–46.0)
Lymphs Abs: 1.5 K/uL (ref 0.7–4.0)
MCHC: 33.8 g/dL (ref 30.0–36.0)
MCV: 94.5 fl (ref 78.0–100.0)
Monocytes Absolute: 0.8 K/uL (ref 0.1–1.0)
Monocytes Relative: 9.5 % (ref 3.0–12.0)
Neutro Abs: 5.6 K/uL (ref 1.4–7.7)
Neutrophils Relative %: 68.5 % (ref 43.0–77.0)
Platelets: 275 K/uL (ref 150.0–400.0)
RBC: 4.56 Mil/uL (ref 4.22–5.81)
RDW: 13.4 % (ref 11.5–15.5)
WBC: 8.2 K/uL (ref 4.0–10.5)

## 2023-12-20 LAB — BASIC METABOLIC PANEL WITH GFR
BUN: 10 mg/dL (ref 6–23)
CO2: 27 meq/L (ref 19–32)
Calcium: 8.6 mg/dL (ref 8.4–10.5)
Chloride: 103 meq/L (ref 96–112)
Creatinine, Ser: 0.91 mg/dL (ref 0.40–1.50)
GFR: 81.53 mL/min (ref >60.00)
Glucose, Bld: 112 mg/dL — ABNORMAL HIGH (ref 70–99)
Potassium: 3.5 meq/L (ref 3.5–5.1)
Sodium: 140 meq/L (ref 135–145)

## 2023-12-20 LAB — VITAMIN B12: Vitamin B-12: 366 pg/mL (ref 211–911)

## 2023-12-20 LAB — VITAMIN D 25 HYDROXY (VIT D DEFICIENCY, FRACTURES): VITD: 39.64 ng/mL (ref 30.00–100.00)

## 2023-12-20 LAB — TSH: TSH: 1.06 u[IU]/mL (ref 0.35–5.50)

## 2023-12-20 LAB — PSA: PSA: 22.43 ng/mL — ABNORMAL HIGH (ref 0.10–4.00)

## 2023-12-23 LAB — HEMOGLOBIN A1C: Hgb A1c MFr Bld: 6.6 % — ABNORMAL HIGH (ref 4.6–6.5)

## 2023-12-24 ENCOUNTER — Encounter: Payer: Self-pay | Admitting: Internal Medicine

## 2023-12-24 ENCOUNTER — Ambulatory Visit (INDEPENDENT_AMBULATORY_CARE_PROVIDER_SITE_OTHER): Admitting: Internal Medicine

## 2023-12-24 VITALS — BP 130/76 | HR 67 | Temp 98.5°F | Ht 65.5 in | Wt 156.0 lb

## 2023-12-24 DIAGNOSIS — R7303 Prediabetes: Secondary | ICD-10-CM

## 2023-12-24 DIAGNOSIS — E559 Vitamin D deficiency, unspecified: Secondary | ICD-10-CM | POA: Diagnosis not present

## 2023-12-24 DIAGNOSIS — Z0001 Encounter for general adult medical examination with abnormal findings: Secondary | ICD-10-CM

## 2023-12-24 DIAGNOSIS — Z0000 Encounter for general adult medical examination without abnormal findings: Secondary | ICD-10-CM

## 2023-12-24 DIAGNOSIS — R972 Elevated prostate specific antigen [PSA]: Secondary | ICD-10-CM

## 2023-12-24 DIAGNOSIS — R3129 Other microscopic hematuria: Secondary | ICD-10-CM | POA: Diagnosis not present

## 2023-12-24 DIAGNOSIS — E538 Deficiency of other specified B group vitamins: Secondary | ICD-10-CM

## 2023-12-24 DIAGNOSIS — E7849 Other hyperlipidemia: Secondary | ICD-10-CM | POA: Diagnosis not present

## 2023-12-24 DIAGNOSIS — N400 Enlarged prostate without lower urinary tract symptoms: Secondary | ICD-10-CM

## 2023-12-24 NOTE — Assessment & Plan Note (Signed)
 Lab Results  Component Value Date   VITAMINB12 366 12/20/2023   Low normal, restart oral replacement - b12 1000 mcg every day x 6 mo

## 2023-12-24 NOTE — Patient Instructions (Addendum)
 Please have the Tdap tetanus shot at the pharmacy  Please take OTC Vitamin D3 at 2000 units per day, indefinitely  Please continue all other medications as before, and refills have been done if requested.  Please have the pharmacy call with any other refills you may need.  Please continue your efforts at being more active, low cholesterol diet, and weight control.  You are otherwise up to date with prevention measures today.  Please keep your appointments with your specialists as you may have planned  You will be contacted regarding the referral for: Urology Dr Renda  Your lab testing was otherwise good!  Please make an Appointment to return in 6 months, or sooner if needed

## 2023-12-24 NOTE — Assessment & Plan Note (Signed)
 Last vitamin D  Lab Results  Component Value Date   VD25OH 39.64 12/20/2023   Low, to start oral replacement

## 2023-12-24 NOTE — Assessment & Plan Note (Signed)
 Lab Results  Component Value Date   HGBA1C 6.6 (H) 12/20/2023   Stable, pt to continue current medical tx - diet, wt control

## 2023-12-24 NOTE — Assessment & Plan Note (Signed)
 Age and sex appropriate education and counseling updated with regular exercise and diet Referrals for preventative services - none needed Immunizations addressed - declines flu shot, for tdap at the pharmacy Smoking counseling  - none needed Evidence for depression or other mood disorder - none significant Most recent labs reviewed. I have personally reviewed and have noted: 1) the patient's medical and social history 2) The patient's current medications and supplements 3) The patient's height, weight, and BMI have been recorded in the chart

## 2023-12-24 NOTE — Assessment & Plan Note (Signed)
 Lab Results  Component Value Date   LDLCALC 140 (H) 12/20/2023   Uncontrolled, has not tolerated statin in past, pt to continue lower chol diet, declines repatha or zetia

## 2023-12-24 NOTE — Assessment & Plan Note (Signed)
 Not currently taking flomax  as not sure is helping,  to f/u any worsening symptoms or concerns

## 2023-12-24 NOTE — Progress Notes (Signed)
 Patient ID: [name] [name], male   DOB: [birth year], 78 y.o.   MRN: [id number]         Chief Complaint:: wellness exam and low vit d, prostatism, elevated PSA, microhematuria, hld, PreDM       HPI:  [name] [name] is a 78 y.o. male here for wellness exam; for tdap at the pharmacy, o/w declines flu shot, o/w up to date                        Also Pt denies chest pain, increased sob or doe, wheezing, orthopnea, PND, increased LE swelling, palpitations, dizziness or syncope.   Pt denies polydipsia, polyuria, or new focal neuro s/s.    Pt denies fever, wt loss, night sweats, loss of appetite, or other constitutional symptoms  Denies urinary symptoms such as dysuria, frequency, urgency, flank pain, hematuria or n/v, fever, chills.  In fact has not had to take the flomax  recently since did not seem to be an improvement.   Pt is s/p prostate bx per urology negative, last seen approx 2 yrs ago per Dr Renda.   Wt Readings from Last 3 Encounters:  12/24/23 156 lb (70.8 kg)  08/20/22 156 lb 3.2 oz (70.9 kg)  02/09/22 150 lb (68 kg)   BP Readings from Last 3 Encounters:  12/24/23 130/76  03/26/23 (!) 156/85  02/12/23 (!) 159/86   Immunization History  Administered Date(s) Administered   Fluad Quad(high Dose 65+) 11/26/2018, 12/05/2020   INFLUENZA, HIGH DOSE SEASONAL PF 11/12/2012, 11/29/2016, 10/29/2017   PFIZER(Purple Top)SARS-COV-2 Vaccination 03/21/2019, 04/13/2019   Pneumococcal Conjugate-13 09/21/2016   Pneumococcal Polysaccharide-23 10/03/2017   Tdap 07/23/2011   Zoster Recombinant(Shingrix) 01/09/2019, 03/16/2019   Health Maintenance Due  Topic Date Due   DTaP/Tdap/Td (2 - Td or Tdap) 07/22/2021   Medicare Annual Wellness (AWV)  02/10/2023      Past Medical History:  Diagnosis Date   History of kidney stones    Hyperlipidemia    Osteoarthritis    Right inguinal hernia    Umbilical hernia    Past Surgical History:  Procedure Laterality Date   EXTRACORPOREAL SHOCK WAVE  LITHOTRIPSY Left 10/29/2016   Procedure: LEFT EXTRACORPOREAL SHOCK WAVE LITHOTRIPSY (ESWL);  Surgeon: Chauncey Redell Agent, MD;  Location: [facility];  Service: Urology;  Laterality: Left;   HERNIA REPAIR     LIH   HERNIA REPAIR  12/06/10   umbilical hernia    INGUINAL HERNIA REPAIR Right 09/04/2018   Procedure: RIGHT INGUINAL HERNIA REPAIR WITH MESH;  Surgeon: Ebbie Cough, MD;  Location: [facility];  Service: General;  Laterality: Right;   VASECTOMY      reports that he has never smoked. He has never used smokeless tobacco. He reports current alcohol use. He reports that he does not use drugs. family history is not on file. Allergies  Allergen Reactions   Crestor  [Rosuvastatin ] Other (See Comments)    myalgias   Lipitor [Atorvastatin ] Other (See Comments)    feels funny inside   Current Outpatient Medications on File Prior to Visit  Medication Sig Dispense Refill   tamsulosin  (FLOMAX ) 0.4 MG CAPS capsule TAKE 1 CAPSULE BY MOUTH EVERYDAY AT BEDTIME (Patient not taking: Reported on 12/24/2023) 90 capsule 3   No current facility-administered medications on file prior to visit.        ROS:  All others reviewed and negative.  Objective        PE:  BP 130/76 (BP  Location: Right Arm, Patient Position: Sitting, Cuff Size: Normal)   Pulse 67   Temp 98.5 F (36.9 C) (Oral)   Ht 5' 5.5 (1.664 m)   Wt 156 lb (70.8 kg)   SpO2 99%   BMI 25.56 kg/m                 Constitutional: Pt appears in NAD               HENT: Head: NCAT.                Right Ear: External ear normal.                 Left Ear: External ear normal.                Eyes: . Pupils are equal, round, and reactive to light. Conjunctivae and EOM are normal               Nose: without d/c or deformity               Neck: Neck supple. Gross normal ROM               Cardiovascular: Normal rate and regular rhythm.                 Pulmonary/Chest: Effort normal and breath sounds without rales or wheezing.                 Abd:  Soft, NT, ND, + BS, no organomegaly               Neurological: Pt is alert. At baseline orientation, motor grossly intact               Skin: Skin is warm. No rashes, no other new lesions, LE edema - none               Psychiatric: Pt behavior is normal without agitation   Micro: none  Cardiac tracings I have personally interpreted today:  none  Pertinent Radiological findings (summarize): none   Lab Results  Component Value Date   WBC 8.2 12/20/2023   HGB 14.6 12/20/2023   HCT 43.1 12/20/2023   PLT 275.0 12/20/2023   GLUCOSE 112 (H) 12/20/2023   CHOL 200 12/20/2023   TRIG 88.0 12/20/2023   HDL 42.40 12/20/2023   LDLCALC 140 (H) 12/20/2023   ALT 12 12/20/2023   AST 15 12/20/2023   NA 140 12/20/2023   K 3.5 12/20/2023   CL 103 12/20/2023   CREATININE 0.91 12/20/2023   BUN 10 12/20/2023   CO2 27 12/20/2023   TSH 1.06 12/20/2023   PSA 22.43 (H) 12/20/2023   HGBA1C 6.6 (H) 12/20/2023   Assessment/Plan:  [name] [name] is a 78 y.o. White or Caucasian [1] male with  has a past medical history of History of kidney stones, Hyperlipidemia, Osteoarthritis, Right inguinal hernia, and Umbilical hernia.  Prostatism Not currently taking flomax  as not sure is helping,  to f/u any worsening symptoms or concerns  Encounter for well adult exam with abnormal findings Age and sex appropriate education and counseling updated with regular exercise and diet Referrals for preventative services - none needed Immunizations addressed - declines flu shot, for tdap at the pharmacy Smoking counseling  - none needed Evidence for depression or other mood disorder - none significant Most recent labs reviewed. I have personally reviewed and have noted: 1) the patient's medical and social history 2) The patient's  current medications and supplements 3) The patient's height, weight, and BMI have been recorded in the chart   Vitamin D  deficiency Last vitamin D  Lab Results   Component Value Date   VD25OH 39.64 12/20/2023   Low, to start oral replacement   Prediabetes Lab Results  Component Value Date   HGBA1C 6.6 (H) 12/20/2023   Stable, pt to continue current medical tx - diet, wt control   Hyperlipidemia Lab Results  Component Value Date   LDLCALC 140 (H) 12/20/2023   Uncontrolled, has not tolerated statin in past, pt to continue lower chol diet, declines repatha or zetia   B12 deficiency Lab Results  Component Value Date   VITAMINB12 366 12/20/2023   Low normal, restart oral replacement - b12 1000 mcg every day x 6 mo   Microhematuria Asymptomatic - for urology referral, likely needs CT and or cysto  ELEVATED PROSTATE SPECIFIC ANTIGEN Lab Results  Component Value Date   PSA 22.43 (H) 12/20/2023   PSA 14.58 (H) 12/04/2021   PSA 12.35 (H) 01/06/2020   Large increase despite hx of biopsy x 2 negative for malignancy; pt also for urology referral Followup: Return in about 6 months (around 06/22/2024).  Lynwood Rush, MD 12/24/2023 10:58 AM [facility]

## 2023-12-24 NOTE — Assessment & Plan Note (Signed)
 Lab Results  Component Value Date   PSA 22.43 (H) 12/20/2023   PSA 14.58 (H) 12/04/2021   PSA 12.35 (H) 01/06/2020   Large increase despite hx of biopsy x 2 negative for malignancy; pt also for urology referral

## 2023-12-24 NOTE — Assessment & Plan Note (Signed)
 Asymptomatic - for urology referral, likely needs CT and or cysto
